# Patient Record
Sex: Female | Born: 1985 | Race: Black or African American | Hispanic: No | Marital: Single | Smoking: Current every day smoker
Health system: Southern US, Community
[De-identification: ages and names within clinical notes are randomized; demographics above are authoritative.]

## PROBLEM LIST (undated history)

## (undated) DIAGNOSIS — G4733 Obstructive sleep apnea (adult) (pediatric): Secondary | ICD-10-CM

## (undated) DIAGNOSIS — I10 Essential (primary) hypertension: Secondary | ICD-10-CM

## (undated) DIAGNOSIS — G2581 Restless legs syndrome: Secondary | ICD-10-CM

## (undated) HISTORY — DX: Restless legs syndrome: G25.81

## (undated) HISTORY — DX: Obstructive sleep apnea (adult) (pediatric): G47.33

---
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
## 2004-08-27 ENCOUNTER — Ambulatory Visit: Payer: Self-pay | Admitting: Pulmonary Disease

## 2004-09-13 ENCOUNTER — Other Ambulatory Visit: Admission: RE | Admit: 2004-09-13 | Discharge: 2004-09-13 | Payer: Self-pay | Admitting: Family Medicine

## 2004-12-21 ENCOUNTER — Emergency Department (HOSPITAL_COMMUNITY): Admission: EM | Admit: 2004-12-21 | Discharge: 2004-12-21 | Payer: Self-pay | Admitting: Emergency Medicine

## 2005-08-04 ENCOUNTER — Emergency Department (HOSPITAL_COMMUNITY): Admission: EM | Admit: 2005-08-04 | Discharge: 2005-08-04 | Payer: Self-pay | Admitting: Family Medicine

## 2005-09-25 ENCOUNTER — Other Ambulatory Visit: Admission: RE | Admit: 2005-09-25 | Discharge: 2005-09-25 | Payer: Self-pay | Admitting: Family Medicine

## 2005-10-10 ENCOUNTER — Emergency Department (HOSPITAL_COMMUNITY): Admission: EM | Admit: 2005-10-10 | Discharge: 2005-10-10 | Payer: Self-pay | Admitting: Emergency Medicine

## 2005-12-29 ENCOUNTER — Ambulatory Visit: Payer: Self-pay | Admitting: Pulmonary Disease

## 2006-01-28 ENCOUNTER — Encounter: Payer: Self-pay | Admitting: Pulmonary Disease

## 2006-01-28 ENCOUNTER — Ambulatory Visit (HOSPITAL_BASED_OUTPATIENT_CLINIC_OR_DEPARTMENT_OTHER): Admission: RE | Admit: 2006-01-28 | Discharge: 2006-01-28 | Payer: Self-pay | Admitting: Pulmonary Disease

## 2006-02-04 ENCOUNTER — Ambulatory Visit: Payer: Self-pay | Admitting: Pulmonary Disease

## 2006-02-19 ENCOUNTER — Ambulatory Visit: Payer: Self-pay | Admitting: Pulmonary Disease

## 2006-03-17 ENCOUNTER — Emergency Department (HOSPITAL_COMMUNITY): Admission: EM | Admit: 2006-03-17 | Discharge: 2006-03-17 | Payer: Self-pay | Admitting: Emergency Medicine

## 2006-03-27 ENCOUNTER — Ambulatory Visit: Payer: Self-pay | Admitting: Pulmonary Disease

## 2006-08-25 ENCOUNTER — Ambulatory Visit: Payer: Self-pay | Admitting: Pulmonary Disease

## 2007-01-20 ENCOUNTER — Emergency Department (HOSPITAL_COMMUNITY): Admission: EM | Admit: 2007-01-20 | Discharge: 2007-01-20 | Payer: Self-pay | Admitting: Emergency Medicine

## 2007-01-21 ENCOUNTER — Emergency Department (HOSPITAL_COMMUNITY): Admission: EM | Admit: 2007-01-21 | Discharge: 2007-01-21 | Payer: Self-pay | Admitting: Emergency Medicine

## 2007-05-22 ENCOUNTER — Encounter: Payer: Self-pay | Admitting: Pulmonary Disease

## 2007-06-22 ENCOUNTER — Emergency Department (HOSPITAL_COMMUNITY): Admission: EM | Admit: 2007-06-22 | Discharge: 2007-06-23 | Payer: Self-pay | Admitting: Emergency Medicine

## 2008-03-28 ENCOUNTER — Telehealth (INDEPENDENT_AMBULATORY_CARE_PROVIDER_SITE_OTHER): Payer: Self-pay | Admitting: *Deleted

## 2008-03-29 ENCOUNTER — Ambulatory Visit: Payer: Self-pay | Admitting: Pulmonary Disease

## 2008-03-29 DIAGNOSIS — G4733 Obstructive sleep apnea (adult) (pediatric): Secondary | ICD-10-CM

## 2008-03-29 DIAGNOSIS — G2581 Restless legs syndrome: Secondary | ICD-10-CM

## 2008-03-29 DIAGNOSIS — G47419 Narcolepsy without cataplexy: Secondary | ICD-10-CM

## 2008-03-31 ENCOUNTER — Telehealth (INDEPENDENT_AMBULATORY_CARE_PROVIDER_SITE_OTHER): Payer: Self-pay | Admitting: *Deleted

## 2008-06-23 ENCOUNTER — Encounter: Payer: Self-pay | Admitting: Pulmonary Disease

## 2008-06-26 ENCOUNTER — Encounter (INDEPENDENT_AMBULATORY_CARE_PROVIDER_SITE_OTHER): Payer: Self-pay | Admitting: *Deleted

## 2008-07-04 ENCOUNTER — Telehealth (INDEPENDENT_AMBULATORY_CARE_PROVIDER_SITE_OTHER): Payer: Self-pay | Admitting: *Deleted

## 2008-08-02 IMAGING — CR DG CHEST 2V
2 series · 2 of 2 positions shown · non-contrast
Comparison: None.

CLINICAL DATA: 21-year-old female, vomiting, shortness of breath.  
 CHEST ? 2 VIEW:

[w chest pa]
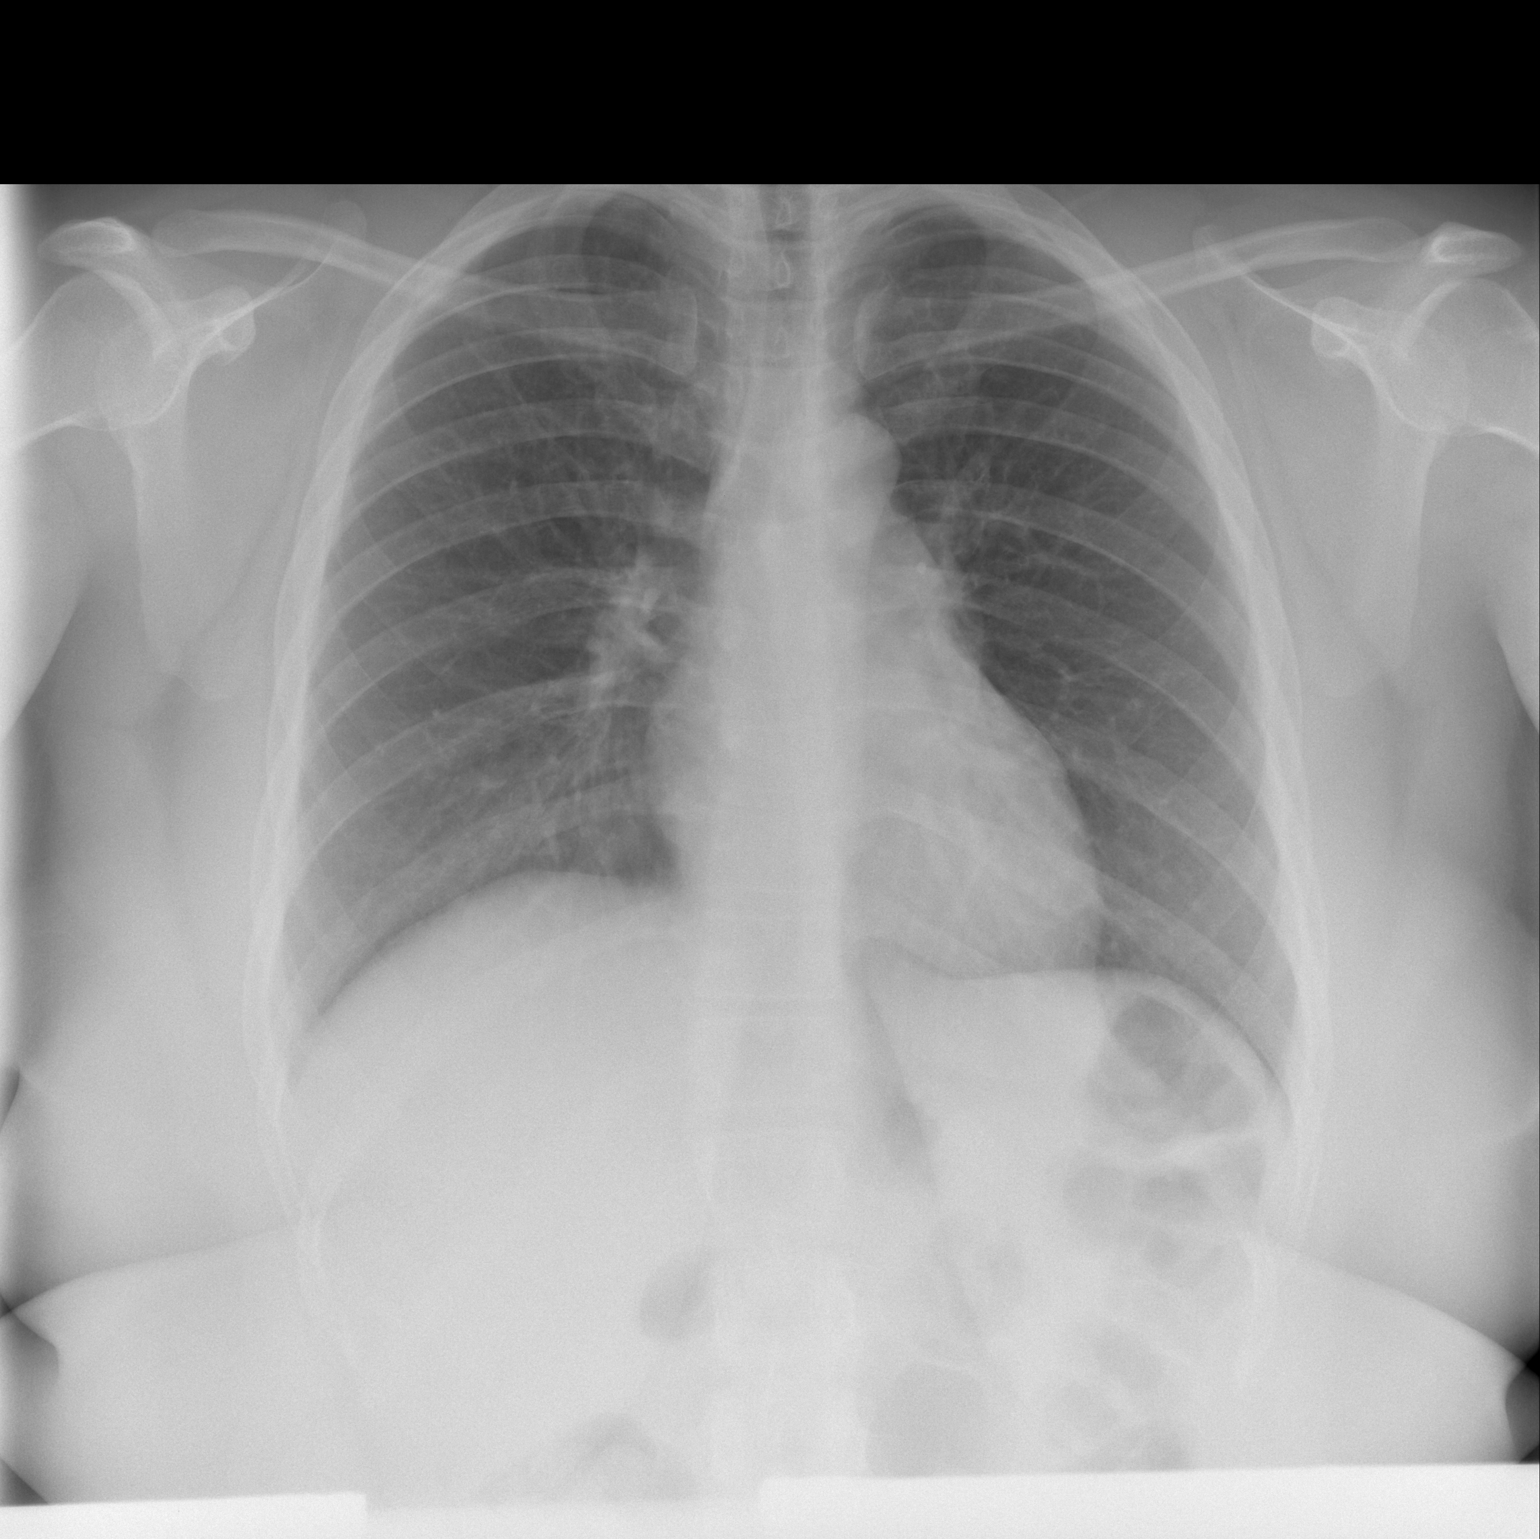

[w chest lat]
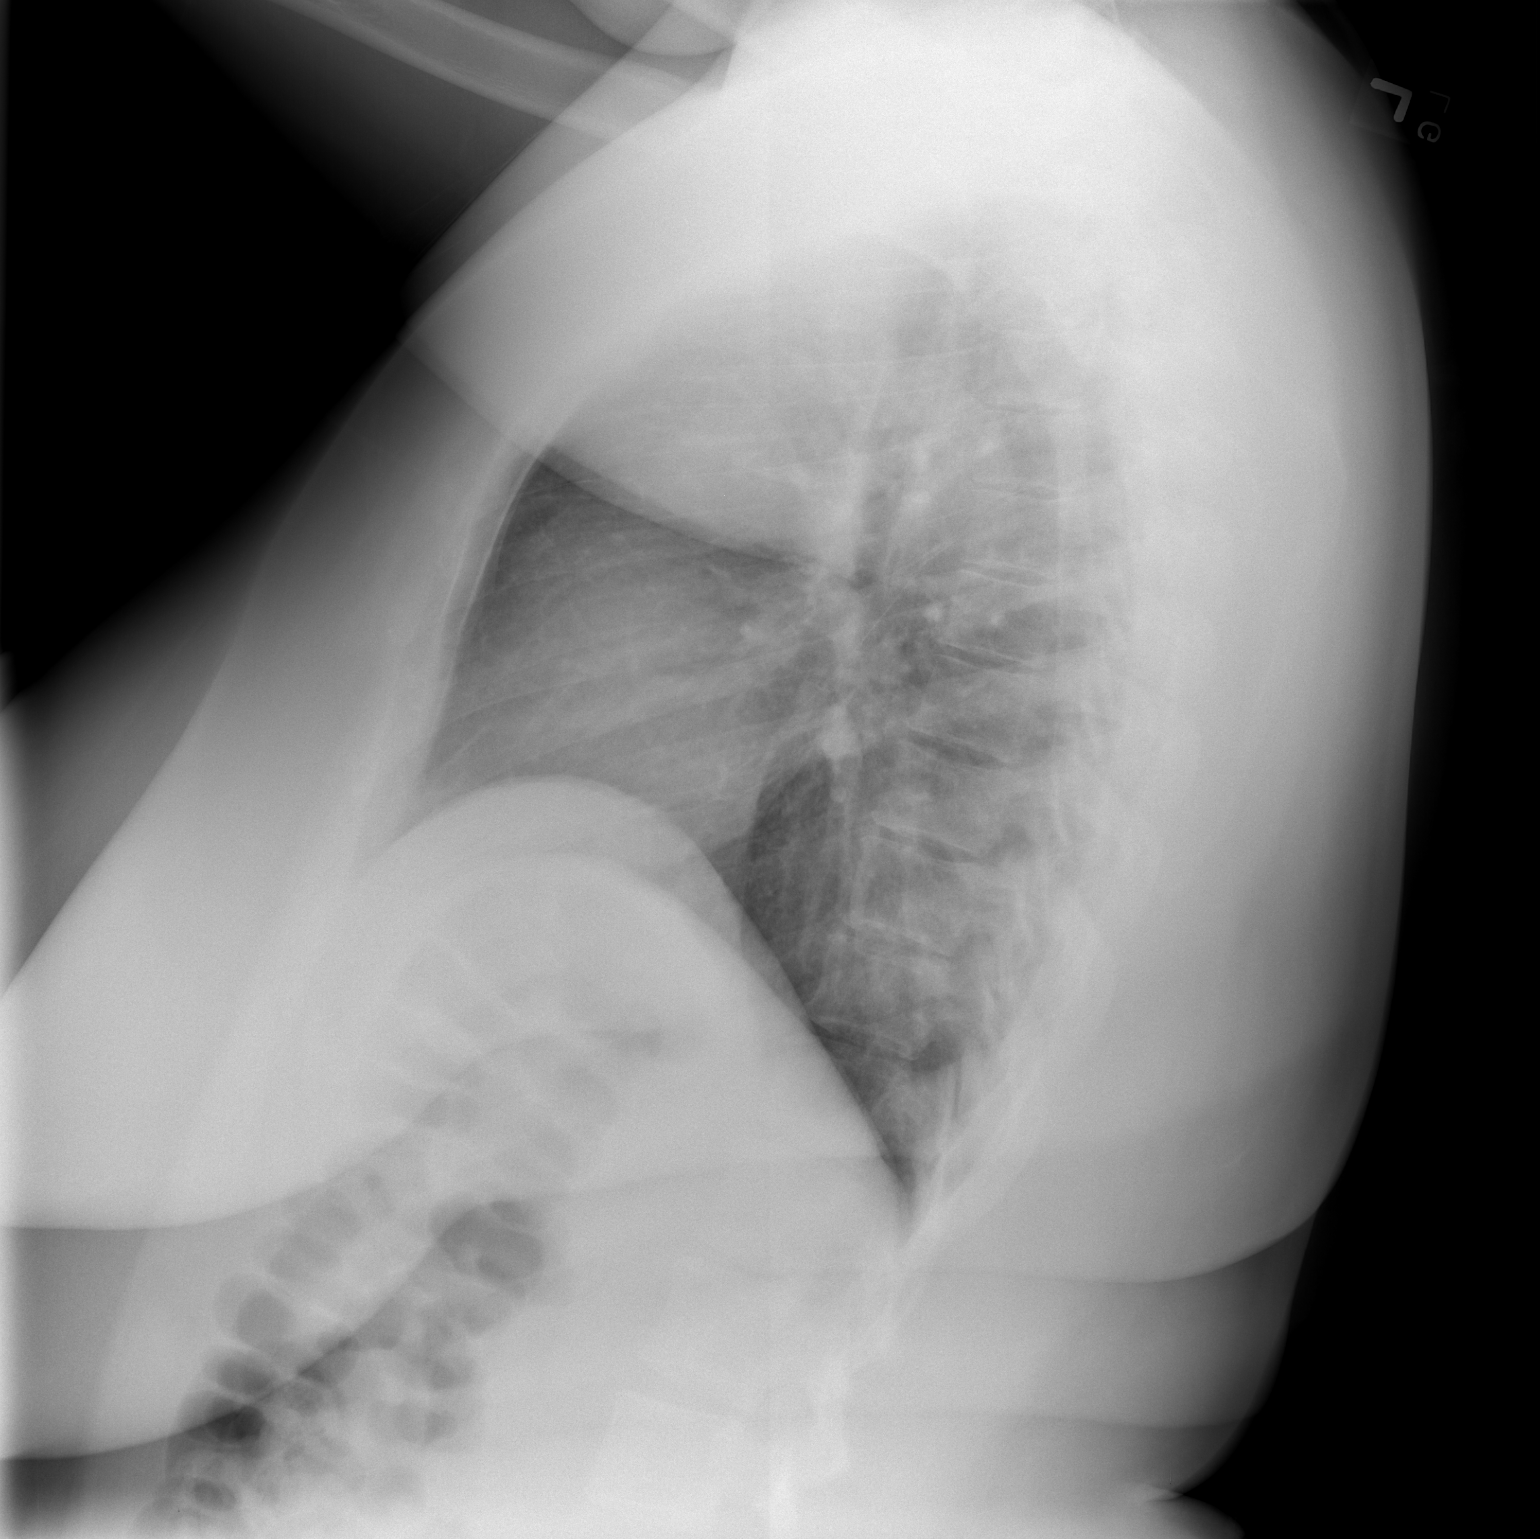

[2 of 2 positions shown; findings below may reference images not displayed]

FINDINGS: The heart size and mediastinal contours are within normal limits.  Both lungs are clear.  The visualized skeletal structures are unremarkable.
IMPRESSION: No active cardiopulmonary disease.

## 2008-08-06 ENCOUNTER — Emergency Department (HOSPITAL_COMMUNITY): Admission: EM | Admit: 2008-08-06 | Discharge: 2008-08-06 | Payer: Self-pay | Admitting: Emergency Medicine

## 2008-08-28 ENCOUNTER — Encounter: Payer: Self-pay | Admitting: Pulmonary Disease

## 2008-10-23 ENCOUNTER — Ambulatory Visit: Payer: Self-pay | Admitting: Pulmonary Disease

## 2009-02-08 ENCOUNTER — Encounter: Payer: Self-pay | Admitting: Pulmonary Disease

## 2009-02-13 ENCOUNTER — Emergency Department (HOSPITAL_COMMUNITY): Admission: EM | Admit: 2009-02-13 | Discharge: 2009-02-13 | Payer: Self-pay | Admitting: Emergency Medicine

## 2009-04-23 ENCOUNTER — Ambulatory Visit: Payer: Self-pay | Admitting: Pulmonary Disease

## 2009-06-08 ENCOUNTER — Emergency Department (HOSPITAL_COMMUNITY): Admission: EM | Admit: 2009-06-08 | Discharge: 2009-06-08 | Payer: Self-pay | Admitting: Emergency Medicine

## 2009-07-01 ENCOUNTER — Emergency Department (HOSPITAL_COMMUNITY): Admission: EM | Admit: 2009-07-01 | Discharge: 2009-07-01 | Payer: Self-pay | Admitting: Emergency Medicine

## 2009-07-12 ENCOUNTER — Encounter: Payer: Self-pay | Admitting: Pulmonary Disease

## 2009-07-18 ENCOUNTER — Emergency Department (HOSPITAL_COMMUNITY): Admission: EM | Admit: 2009-07-18 | Discharge: 2009-07-18 | Payer: Self-pay | Admitting: Emergency Medicine

## 2009-07-26 ENCOUNTER — Ambulatory Visit: Payer: Self-pay | Admitting: Obstetrics and Gynecology

## 2009-08-02 ENCOUNTER — Ambulatory Visit (HOSPITAL_COMMUNITY): Admission: RE | Admit: 2009-08-02 | Discharge: 2009-08-02 | Payer: Self-pay | Admitting: Obstetrics and Gynecology

## 2009-09-24 ENCOUNTER — Encounter: Payer: Self-pay | Admitting: Pulmonary Disease

## 2009-12-03 ENCOUNTER — Encounter: Payer: Self-pay | Admitting: Pulmonary Disease

## 2009-12-12 ENCOUNTER — Ambulatory Visit: Payer: Self-pay | Admitting: Pulmonary Disease

## 2010-04-08 ENCOUNTER — Encounter: Payer: Self-pay | Admitting: Obstetrics & Gynecology

## 2010-04-08 LAB — CONVERTED CEMR LAB: GC Probe Amp, Genital: NEGATIVE

## 2010-04-09 ENCOUNTER — Encounter: Payer: Self-pay | Admitting: Obstetrics & Gynecology

## 2010-06-12 ENCOUNTER — Ambulatory Visit: Payer: Self-pay | Admitting: Pulmonary Disease

## 2010-07-03 ENCOUNTER — Encounter: Payer: Self-pay | Admitting: Pulmonary Disease

## 2010-07-16 NOTE — Medication Information (Signed)
Summary: Nuvigil[facility]  [employer]   Imported By: Lester Skidmore 07/18/2009 11:06:30  _____________________________________________________________________  External Attachment:    Type:   Image     Comment:   External Document

## 2010-07-16 NOTE — Medication Information (Signed)
Summary: Nuvigil[facility]  [employer]   Imported By: Sherian Rein 12/13/2009 14:44:34  _____________________________________________________________________  External Attachment:    Type:   Image     Comment:   External Document

## 2010-07-16 NOTE — Medication Information (Signed)
Summary: Refill for Nuvigil[facility]  Refill for [employer]   Imported By: Sherian Rein 12/12/2009 08:42:33  _____________________________________________________________________  External Attachment:    Type:   Image     Comment:   External Document

## 2010-07-16 NOTE — Assessment & Plan Note (Signed)
Summary: rov for osa, narcolepsy   CC:  Pt is here for a 6 month f/u appt.  Pt states she is able to stay awake and concentrate throughout the day on Nuvigil.  Pt does c/o dry mouth and wonders if this is d/t the Nuvigil.   Pt states she is wearing her cpap machine every night.   Pt states she only wears her machine in 2 hour increments at a time because her "nose gets sore and bleeds."  .  History of Present Illness: the pt comes in today for f/u of her known osa and narcolepsy.  She is on cpap, but is having issues with nasal dryness and crusting, as well as dry mouth.  She is not adjusting the heater on her humidifier, and and admits to mouth opening.  She is not wearing a chin strap because it is "uncomfortable", and is not interested in wearing a full face mask because it is "uncomfortable".  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  Current Medications (verified): 1)  Nuvigil 150 Mg Tabs (Armodafinil) .... One Each Am  Allergies (verified): 1)  ! Benadryl  Past History:  Past Medical History: OSA narcolepsy PMH-FH-SH reviewed-no changes except otherwise noted  Review of Systems  The patient denies shortness of breath with activity, shortness of breath at rest, productive cough, non-productive cough, coughing up blood, chest pain, irregular heartbeats, acid heartburn, indigestion, loss of appetite, weight change, abdominal pain, difficulty swallowing, sore throat, tooth/dental problems, headaches, nasal congestion/difficulty breathing through nose, sneezing, itching, ear ache, anxiety, depression, hand/feet swelling, joint stiffness or pain, rash, change in color of mucus, and fever.    Vital Signs:  Patient profile:   25 year old female Height:      62 inches Weight:      280 pounds O2 Sat:      99 % on Room air Temp:     98.4 degrees F oral Pulse rate:   84 / minute BP sitting:   110 / 70  (left arm) Cuff size:   large  Vitals  Entered By: Arman Filter LPN (December 12, 2009 1:47 PM)  O2 Flow:  Room air  CC: Pt is here for a 6 month f/u appt.  Pt states she is able to stay awake and concentrate throughout the day on Nuvigil.  Pt does c/o dry mouth and wonders if this is d/t the Nuvigil.   Pt states she is wearing her cpap machine every night.   Pt states she only wears her machine in 2 hour increments at a time because her "nose gets sore and bleeds."   Comments Medications reviewed with patient Arman Filter LPN  December 12, 2009 1:47 PM    Physical Exam  General:  obese female in nad Nose:  no skin breakdown or pressure necrosis from cpap mask Extremities:  no edema or cyanosis Neurologic:  alert and oriented, moves all 4.   Impression & Recommendations:  Problem # 1:  OBSTRUCTIVE SLEEP APNEA (ICD-327.23)  the pt is having a lot of mouth opening by her history while wearing nasal pillows.  I have asked her to try a full face mask again, and reminded her there are a lot of new masks which may be more comfortable than what she has tried in the past.  She is not willing to do this, nor is she willing to try a chin strap again.  I have asked her to  try turning up the heat on her humidifier to help with nasal dryness.  I have also encouraged her to work aggressively on weight loss.  The pt also has a history suggestive of RLS, but is not willing to take a dopamine agonist.  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  She is able to remain functional as long as she stays on nuvigil.  I have reminded her again of the importance of sleep hygiene, catnaps when she is able.  Other Orders: Est. Patient Level IV (47829)  Patient Instructions: 1)  consider trying a full face mask again or chin strap 2)  work on weight loss 3)  continue with nuvigil to help with daytime alertness. 4)  followup with me in 6mos.

## 2010-07-16 NOTE — Medication Information (Signed)
Summary: Approval for Patient Assistance Program/[facility]  Approval for Patient Assistance Program/[facility]   Imported By: Lanelle Bal 09/27/2009 10:22:16  _____________________________________________________________________  External Attachment:    Type:   Image     Comment:   External Document

## 2010-07-18 NOTE — Assessment & Plan Note (Signed)
Summary: rov for osa/narcolepsy   CC:  6 month follow up. pt states she uses her cpap everynight x 4-5 hrs a night. Pt states she needs a new mask. Marland Kitchen  History of Present Illness: the pt comes in today for f/u of her multiple sleep issues.  She has known osa and narcolepsy, and is being maintained on cpap and nuvigil to help with her functional status during waking hours.  Unfortunately, she has not kept to any type of sleep schedule, and now is sleeping during the day and staying awake at night?  She states this "is a better schedule for her".  She states she is wearing cpap during the day while sleeping, and takes nuvigil to stay awake as needed.  Unfortunately, she has continued to gain weight (up 7 pounds from last visit).    Current Medications (verified): 1)  Nuvigil 150 Mg Tabs (Armodafinil) .... One Each Am  Allergies (verified): 1)  ! Benadryl  Past History:  Past Medical History: OSA--AHI 6/hr 2003.  mask issues, but unwilling to try full face or chin strap narcolepsy--+MSLT 2004 with latency 2.21min and 5 sorems. RLS--not willing to stay on dopamine agonist  Review of Systems       The patient complains of weight change.  The patient denies shortness of breath with activity, shortness of breath at rest, productive cough, non-productive cough, coughing up blood, chest pain, irregular heartbeats, acid heartburn, indigestion, loss of appetite, abdominal pain, difficulty swallowing, sore throat, tooth/dental problems, headaches, nasal congestion/difficulty breathing through nose, sneezing, itching, ear ache, anxiety, depression, hand/feet swelling, joint stiffness or pain, rash, change in color of mucus, and fever.    Vital Signs:  Patient profile:   25 year old female Height:      62 inches Weight:      286.50 pounds BMI:     52.59 O2 Sat:      98 % on Room air Temp:     98.1 degrees F oral Pulse rate:   94 / minute BP sitting:   124 / 88  (left arm) Cuff size:    large  Vitals Entered By: Carver Fila (June 12, 2010 1:29 PM)  O2 Flow:  Room air CC: 6 month follow up. pt states she uses her cpap everynight x 4-5 hrs a night. Pt states she needs a new mask.  Comments meds and allergies updated Phone number updated Carver Fila  June 12, 2010 1:29 PM    Physical Exam  General:  obese female in nad Extremities:  no edema or cyanosis  Neurologic:  alert and oriented, moves all 4. does not appear overly sleepy.   Impression & Recommendations:  Problem # 1:  OBSTRUCTIVE SLEEP APNEA (ICD-327.23) the pt is sleeping during the day and staying up at night by personal choice.  She is wearing cpap during the day when she sleeps, and feels this does help.  She is in need of a new mask, and I have also stressed to her the need to work on weight loss.    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  I have stressed to her again the importance of a consistent sleep schedule and good sleep hygiene.    Other Orders: Est. Patient Level III (14782) DME Referral (DME)  Patient Instructions: 1)  continue with cpap during your sleeping hours 2)  will get a new mask for you thru dme.  3)  work on weight loss 4)  continue with nuvigil as needed during your waking hours to remain functional 5)  followup with me in 6mos.

## 2010-07-18 NOTE — Medication Information (Signed)
Summary: Refill form for Nuvigil/[facility]   Refill form for Nuvigil/[facility]   Imported By: Sherian Rein 07/12/2010 11:42:58  _____________________________________________________________________  External Attachment:    Type:   Image     Comment:   External Document

## 2010-09-04 LAB — URINALYSIS, ROUTINE W REFLEX MICROSCOPIC
Bilirubin Urine: NEGATIVE
Glucose, UA: NEGATIVE mg/dL
Ketones, ur: NEGATIVE mg/dL
Protein, ur: NEGATIVE mg/dL
Specific Gravity, Urine: 1.021 (ref 1.005–1.030)
pH: 6.5 (ref 5.0–8.0)

## 2010-09-04 LAB — POCT PREGNANCY, URINE: Preg Test, Ur: NEGATIVE

## 2010-09-07 ENCOUNTER — Emergency Department (HOSPITAL_COMMUNITY)
Admission: EM | Admit: 2010-09-07 | Discharge: 2010-09-08 | Disposition: A | Discharge status: Home / Self Care | Payer: Self-pay | Attending: Emergency Medicine | Admitting: Emergency Medicine

## 2010-09-07 DIAGNOSIS — Z8614 Personal history of Methicillin resistant Staphylococcus aureus infection: Secondary | ICD-10-CM | POA: Insufficient documentation

## 2010-09-07 DIAGNOSIS — R059 Cough, unspecified: Secondary | ICD-10-CM | POA: Insufficient documentation

## 2010-09-07 DIAGNOSIS — R05 Cough: Secondary | ICD-10-CM | POA: Insufficient documentation

## 2010-09-07 DIAGNOSIS — R509 Fever, unspecified: Secondary | ICD-10-CM | POA: Insufficient documentation

## 2010-09-07 DIAGNOSIS — J40 Bronchitis, not specified as acute or chronic: Secondary | ICD-10-CM | POA: Insufficient documentation

## 2010-09-07 DIAGNOSIS — R350 Frequency of micturition: Secondary | ICD-10-CM | POA: Insufficient documentation

## 2010-09-08 LAB — URINALYSIS, ROUTINE W REFLEX MICROSCOPIC
Bilirubin Urine: NEGATIVE
Glucose, UA: NEGATIVE mg/dL
Hgb urine dipstick: NEGATIVE
Ketones, ur: NEGATIVE mg/dL
Nitrite: NEGATIVE
Protein, ur: NEGATIVE mg/dL
Specific Gravity, Urine: 1.023 (ref 1.005–1.030)

## 2010-09-13 IMAGING — US US TRANSVAGINAL NON-OB
1 series · 13 of 25 positions shown · non-contrast
Comparison: None.

CLINICAL DATA: Amenorrhea for 3 years.

TRANSABDOMINAL AND TRANSVAGINAL ULTRASOUND OF PELVIS
TECHNIQUE: Both transabdominal and transvaginal ultrasound
examinations of the pelvis were performed including evaluation of
the uterus, ovaries, adnexal regions, and pelvic cul-de-sac.

[Series 1: us transvaginal non-ob · 0.30mm/px · 13 of 45 slices shown]
[im 1/45]
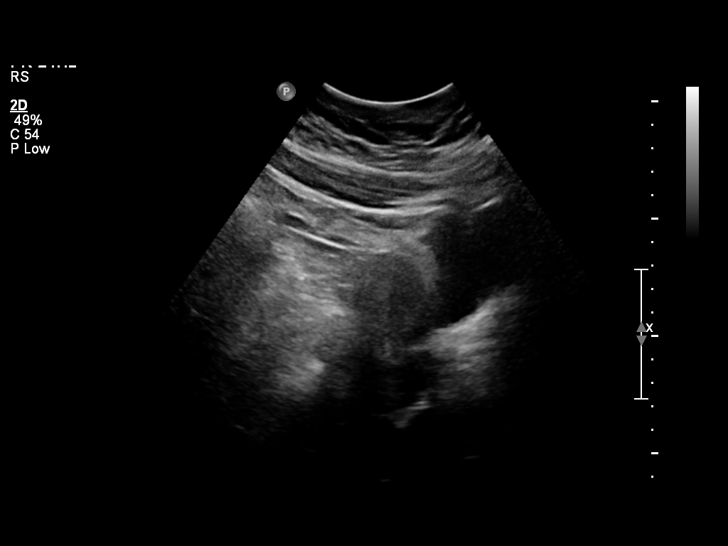
[im 4/45]
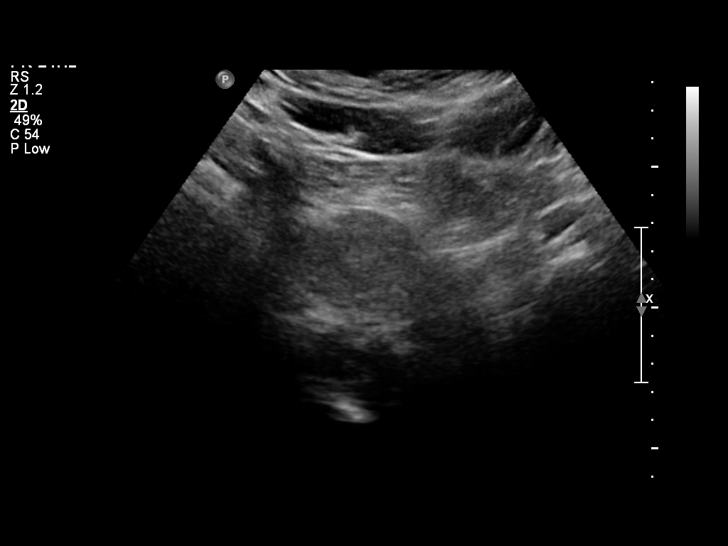
[im 8/45]
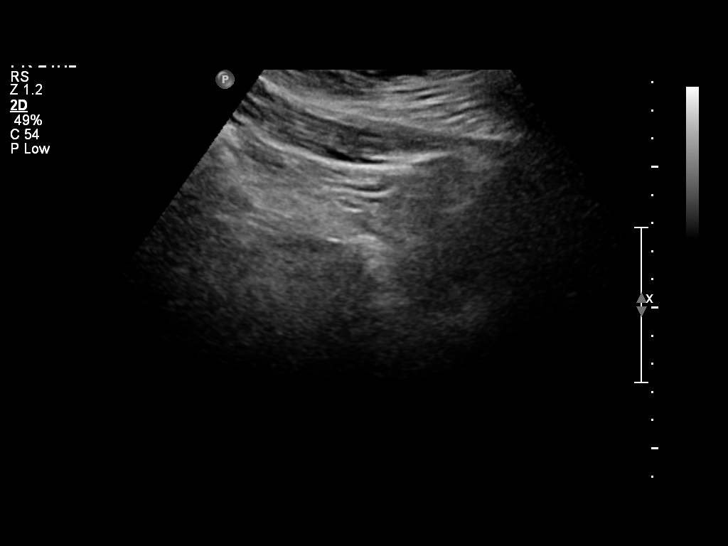
[im 12/45]
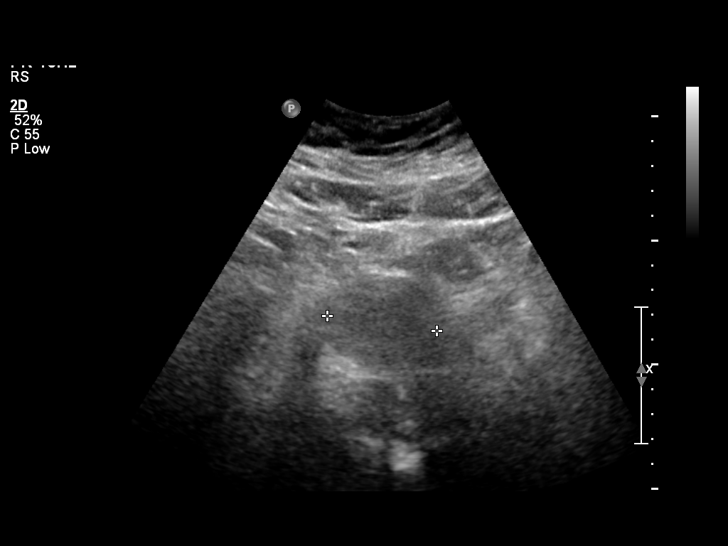
[im 15/45]
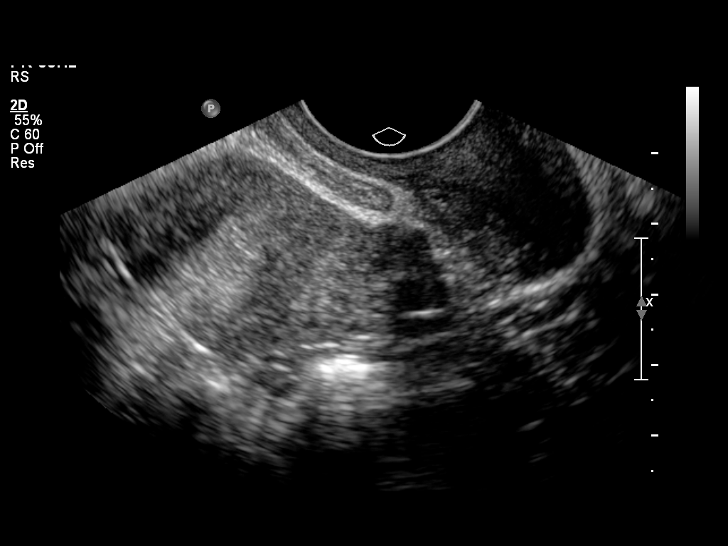
[im 19/45]
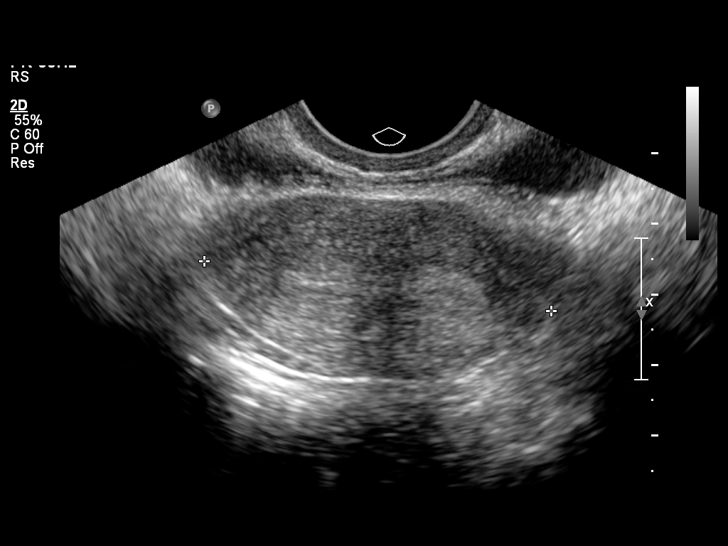
[im 23/45]
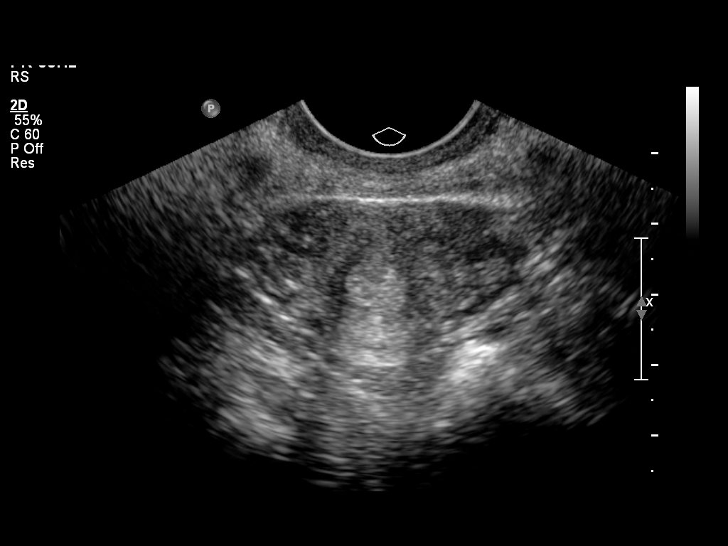
[im 26/45]
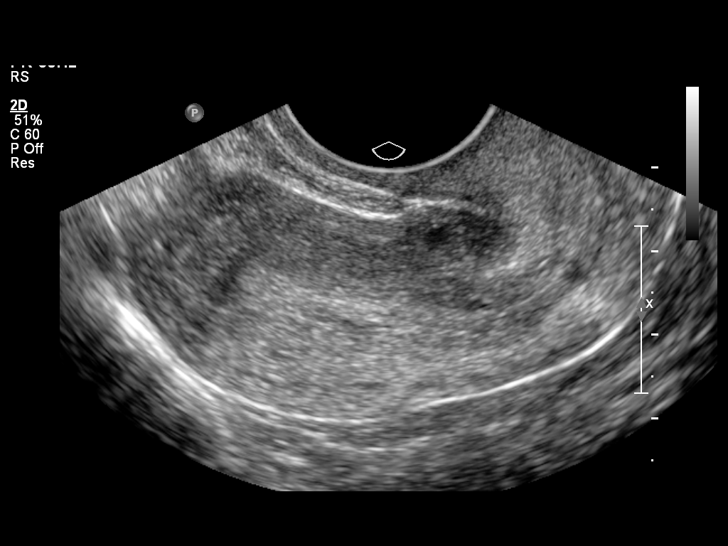
[im 30/45]
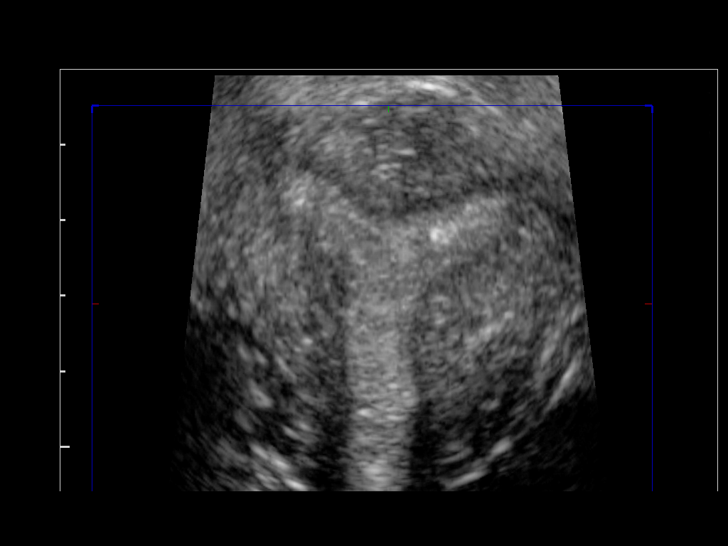
[im 34/45]
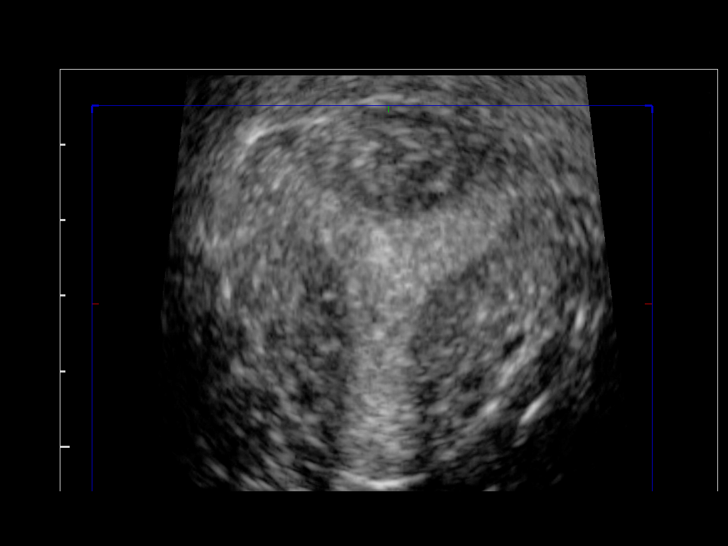
[im 37/45]
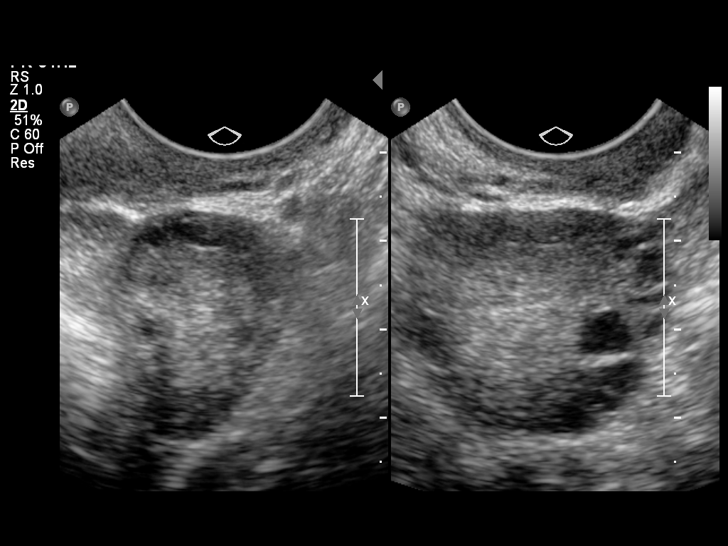
[im 41/45]
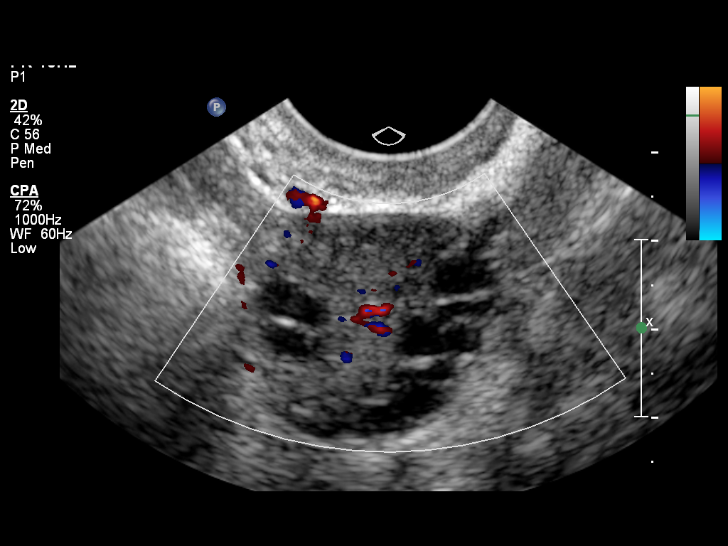
[im 45/45]
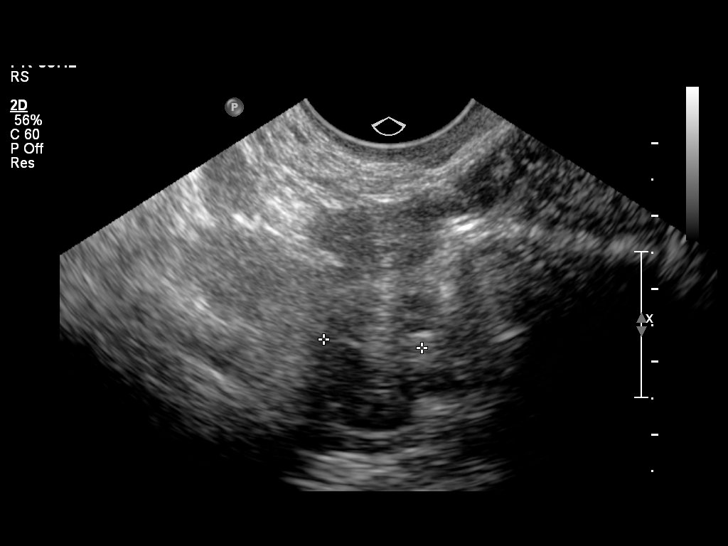

[13 of 25 positions shown; findings below may reference images not displayed]

FINDINGS: Uterus demonstrates a sagittal length of 7.1 cm, an AP width of
cm and a transverse width of 5.0 cm.  A homogeneous uterine
myometrium is seen.

Endometrium is thin and echogenic with an AP width of 4.0 mm.  No
areas of focal thickening or inhomogeneity noted.  On coronal
reconstructed images, incidental note is made of a arcuate
morphology to the endometrial canal.

Right Ovary the right ovary is enlarged measuring 4.1 x 3.4 by
cm.  This ovary was difficult to visualize both endovaginally and
transabdominally and the internal architecture of the ovary is
difficult to assess.

Left Ovary measures 3.1 x 2.7 x 2.0 cm and contains greater than 10
sub centimeter follicles oriented peripherally around the ovarian
stroma.  The central stroma appears slightly echogenic.  Central
vascularity is unremarkable.

Other Findings:  No pelvic fluid or separate adnexal masses are
seen.
IMPRESSION: Normal uterine myometrium and endometrium.  Poorly assessed right
ovary due to poor scanning parameters.

Normal left ovarian size is noted but the orientation of multiple
sub centimeter follicles and slight increase in central
echogenicity raises the possibility of underlying polycystic
ovarian syndrome.  Clinical correlation is recommended.

## 2010-09-16 LAB — URINALYSIS, ROUTINE W REFLEX MICROSCOPIC
Bilirubin Urine: NEGATIVE
Ketones, ur: NEGATIVE mg/dL
Protein, ur: NEGATIVE mg/dL
Urobilinogen, UA: 0.2 mg/dL (ref 0.0–1.0)
pH: 5.5 (ref 5.0–8.0)

## 2010-09-16 LAB — POCT PREGNANCY, URINE: Preg Test, Ur: NEGATIVE

## 2010-09-21 LAB — RAPID STREP SCREEN (MED CTR MEBANE ONLY): Streptococcus, Group A Screen (Direct): NEGATIVE

## 2010-10-18 ENCOUNTER — Telehealth: Payer: Self-pay | Admitting: Pulmonary Disease

## 2010-10-21 NOTE — Telephone Encounter (Signed)
Spoke to pt she is aware we received forms

## 2010-11-01 NOTE — Procedures (Signed)
NAME:  Tiffany Allen, Tiffany Allen NO.:  [id number]   MEDICAL RECORD NO.:  [id number]          PATIENT TYPE:  OUT   LOCATION:  SLEEP CENTER                 FACILITY:  [facility]   PHYSICIAN:  Barbaraann Share, MD,FCCPDATE OF BIRTH:  03-31-1985   DATE OF STUDY:  01/28/2006                              NOCTURNAL POLYSOMNOGRAM    .  REFERRING PHYSICIAN:  Dr. Marcelyn Bruins.   INDICATIONS FOR STUDY:  Persistent disorder of initiating and maintaining  wakefulness.   EPWORTH SCORE:  20.   SLEEP ARCHITECTURE:  The patient had total sleep time of 355 minutes with  adequate slow wave sleep but decreased REM.  Sleep onset latency was fairly  rapid at 5 minutes, and REM onset was normal at 107 minutes. Sleep  efficiency was decreased at 71%.   RESPIRATORY DATA:  The patient was found to have 36 hypopneas and 16 apneas  for respiratory disturbance index of 9 events per hour. Events were not  positional, but there was light to moderate snoring noted throughout in an  intermittent fashion.   OXYGEN DATA:  There was O2 desaturation as low as 81% with the patient's  obstructive events.   CARDIAC DATA:  No clinically significant cardiac arrhythmias.   MOVEMENT/PARASOMNIA:  The patient was found to have 125 leg jerks with 2 per  hour resulting in arousal or awakening.   IMPRESSION/RECOMMENDATIONS:  1. Mild obstructive sleep apnea/hypopnea syndrome with a respiratory      disturbance index of 9 events per hour and O2 desaturation as low as      81%. Treatment for this degree of sleep apnea should focus on weight      loss alone if applicable, upper airway surgery, oral appliance, and      also CPAP.  2. It should be noted the whole study was done with the TV turned on in      the patient's room.  3. Very large numbers of leg jerks with what appears to be significant      sleep disruption. Clinical correlation is suggested.          ______________________________  Barbaraann Share,  MD,FCCP  Diplomate, American Board of Sleep  Medicine   [facility]  D:  02/03/2006 15:43:17  T:  02/04/2006 09:25:11  Job:  [id number]

## 2010-11-01 NOTE — Assessment & Plan Note (Signed)
[facility]                               PULMONARY OFFICE NOTE   Tiffany Allen, Tiffany Allen                     MRN:          [id number]  DATE:02/19/2006                            DOB:          1985/06/21    Tiffany Allen comes in today for follow-up of her continued inappropriate  daytime sleepiness.  She had seen a significant worsening that was leading  to difficulties in school and also her general functional status during the  day.  The patient had been doing fairly well on Provigil and was having to  take it only on an intermittent basis because of financial issues.  We have  gotten her hooked up with the patient assistance programs for Ce[employer]on, and  I have also asked her to look into applying for disability.  Because of the  patient's worsening snoring and daytime symptoms she underwent follow-up  nocturnal polysomnography, which showed a respiratory disturbance index of 9  events per hour and O2 saturation as low as 81%.  She also had light to  moderate snoring noted throughout.  This was an increase in her respiratory  disturbance index from the last study.  The patient also was noted to have  125 leg jerks, 2 per hour, resulting in arousal or awakening.  I have had  some concerns about the possibility of restless leg/periodic leg movements  in the past and have considered a treatment trial with Requip.   PHYSICAL EXAM:  VITAL SIGNS:  BP 116/80, pulse 83, temperature 97.7, weight  is 260 pounds, O2 saturation on room air is 99%.  GENERAL:  She is an obese black female in no acute distress.   IMPRESSION:  1. Narcolepsy without cataplexy.  This may be the sole reason for her      sleepiness.  However, she has had a significant increase in her      symptoms, but has also been only taking Provigil intermittently.  At      this point in time, we need to get her back on this on a regular basis.      She is applying for financial assistance for  Cephalon and also applying      for disability.  2. Questionable superimposed sleep apnea and restless leg syndrome, which      may be contributing to her daytime symptoms.  At this point in time,      the patient is not keen on CPAP, but is willing to give Requip a try.      If she does not see improvement, I really think she needs to give CPAP      a try as well.  She obviously needs to work aggressively on weight      loss.   PLAN:  1. Back on Provigil, 200 mg q. day/b.i.d.  2. Requip 1 mg nightly in an escalating fashion.  Samples have been given      for the patient.  3. The patient is to follow-up in 4 weeks if symptoms or problems.  If she      is continuing  to have severe daytime symptomatology, I would recommend      that she have a trial of CPAP.                                    Barbaraann Share, MD,FCCP   [facility]  DD:  02/27/2006  DT:  03/01/2006  Job #:  [id number]   cc:   Stacie Acres. Cliffton Asters, M.D.

## 2010-12-31 ENCOUNTER — Encounter: Payer: Self-pay | Admitting: Pulmonary Disease

## 2011-01-01 ENCOUNTER — Ambulatory Visit (INDEPENDENT_AMBULATORY_CARE_PROVIDER_SITE_OTHER): Payer: Self-pay | Admitting: Pulmonary Disease

## 2011-01-01 ENCOUNTER — Encounter: Payer: Self-pay | Admitting: Pulmonary Disease

## 2011-01-01 DIAGNOSIS — G47419 Narcolepsy without cataplexy: Secondary | ICD-10-CM

## 2011-01-01 DIAGNOSIS — G4733 Obstructive sleep apnea (adult) (pediatric): Secondary | ICD-10-CM

## 2011-01-01 MED ORDER — MODAFINIL 200 MG PO TABS: 200.0000 mg | ORAL_TABLET | ORAL | Status: DC

## 2011-01-01 NOTE — Progress Notes (Signed)
  Subjective:    Patient ID: Tiffany Allen, female    DOB: 18-Feb-1986, 25 y.o.   MRN: [id number]  HPI The pt comes in today for f/u of her known osa and narcolepsy.  She is wearing cpap compliantly, and denies any issues with mask fit or pressure.  She feels she sleeps well with the device.  She is taking nuvigil for her narcolepsy, but thinks it is causing her diarrhea.  This has never been worked up in the past.  Unfortunately, she is not losing weight.     Review of Systems  Constitutional: Negative for fever and unexpected weight change.  HENT: Negative for ear pain, nosebleeds, congestion, sore throat, rhinorrhea, sneezing, trouble swallowing, dental problem, postnasal drip and sinus pressure.   Eyes: Negative for redness and itching.  Respiratory: Negative for cough, chest tightness, shortness of breath and wheezing.   Cardiovascular: Negative for palpitations and leg swelling.  Gastrointestinal: Negative for nausea and vomiting.  Genitourinary: Negative for dysuria.  Musculoskeletal: Negative for joint swelling.  Skin: Negative for rash.  Neurological: Negative for headaches.  Hematological: Does not bruise/bleed easily.  Psychiatric/Behavioral: Negative for dysphoric mood. The patient is not nervous/anxious.        Objective:   Physical Exam Morbidly obese female in nad Nares without purulence or discharge, no skin breakdown or pressure necrosis from cpap mask. LE without edema, no cyanosis noted. Awake, but appears sleepy, moves all 4        Assessment & Plan:

## 2011-01-01 NOTE — Patient Instructions (Signed)
Continue with cpap, and work on weight loss Continue to take naps when able during day Will change you nuvigil to provigil 200mg  each am followup with me in 6mos.

## 2011-01-04 ENCOUNTER — Encounter: Payer: Self-pay | Admitting: Pulmonary Disease

## 2011-01-04 NOTE — Assessment & Plan Note (Signed)
The pt has known osa for which she is on cpap.  She denies any issues with mask fit or pressure, and feels that she sleeps well with the device.  I have asked her to keep up with mask changes and supplies, and counseled her that she must work on weight loss.

## 2011-01-04 NOTE — Assessment & Plan Note (Signed)
The pt believes her nuvigil is causing diarrhea, but I suspect this is not the case.  Will change her over to provigil, but if the issue continues, would suggest GI evaluation.  I do not want to start her on meds such as ritalin given her young age, and risk of CV complications over a long period of time.

## 2011-01-14 ENCOUNTER — Telehealth: Payer: Self-pay | Admitting: *Deleted

## 2011-01-14 NOTE — Telephone Encounter (Signed)
When pt was seen by [facility] on 7/18, he changed her from Nuvigil to Provigil.  Pt currently gets her Nuvigil free through the [employer].  Pt had requested the same for her Provigil.  Spoke with Almyra Free on this and Almyra Free informed me that because Provigil has a generic, the [employer] doesn't carry this med for patients.  Therefore pt will need to go back to the Nuvigil in order to get this med for free or pay for the Provigil.  Kc, please advise what you prefer to do.  Thanks.

## 2011-01-14 NOTE — Telephone Encounter (Signed)
She told me she was having diarrhea from the nuvigil, hence I changed her to provigil.  Is there some type of assistance program for provigil (I am not sure who is making the generic)?  If not, she will have to decide if the nuvigil really is causing her diarrhea?? (I suspect it is not).  I would like to avoid the other meds that have a lot of cardiovascular side effects long term.

## 2011-01-15 NOTE — Telephone Encounter (Signed)
Will forward to [facility] for recs on this, pls advise thanks!

## 2011-01-16 NOTE — Telephone Encounter (Signed)
PT HAS DECIDED TO STAY ON THE NUVIGIL BECAUSE THERE IS NO PROGRAM FOR PROVIGIL AND SHE HAS NO INCOME COMING IN

## 2011-03-05 LAB — CBC
Hemoglobin: 14.4
MCHC: 33.9
MCV: 82.3
Platelets: 294
RBC: 5.18 — ABNORMAL HIGH
RDW: 13.7
WBC: 14.1 — ABNORMAL HIGH

## 2011-03-05 LAB — BASIC METABOLIC PANEL
Chloride: 101
Creatinine, Ser: 0.88
GFR calc Af Amer: >60
GFR calc non Af Amer: >60

## 2011-03-05 LAB — DIFFERENTIAL
Eosinophils Absolute: 0.4
Eosinophils Relative: 3
Lymphocytes Relative: 39
Neutro Abs: 7.2
Neutrophils Relative %: 51

## 2011-03-05 LAB — URINALYSIS, ROUTINE W REFLEX MICROSCOPIC
Glucose, UA: NEGATIVE
Hgb urine dipstick: NEGATIVE
Nitrite: NEGATIVE
Specific Gravity, Urine: 1.026

## 2011-03-31 LAB — URINALYSIS, ROUTINE W REFLEX MICROSCOPIC
Glucose, UA: NEGATIVE
Leukocytes, UA: NEGATIVE
Nitrite: NEGATIVE
Specific Gravity, Urine: 1.033 — ABNORMAL HIGH
pH: 6

## 2011-03-31 LAB — URINE MICROSCOPIC-ADD ON

## 2011-03-31 LAB — RAPID STREP SCREEN (MED CTR MEBANE ONLY): Streptococcus, Group A Screen (Direct): POSITIVE — AB

## 2011-04-30 ENCOUNTER — Telehealth: Payer: Self-pay | Admitting: Pulmonary Disease

## 2011-04-30 NOTE — Telephone Encounter (Signed)
Pt was calling to find out if her medication list or history shows her ever taking dopamine agonist-Pt had copy of 06-12-10 OV notes-after researching into this I explained to the patient that her medication list doesn't reflect any use of it.

## 2011-06-18 ENCOUNTER — Ambulatory Visit (INDEPENDENT_AMBULATORY_CARE_PROVIDER_SITE_OTHER): Payer: Self-pay | Admitting: Pulmonary Disease

## 2011-06-18 ENCOUNTER — Encounter: Payer: Self-pay | Admitting: Pulmonary Disease

## 2011-06-18 DIAGNOSIS — G47419 Narcolepsy without cataplexy: Secondary | ICD-10-CM

## 2011-06-18 DIAGNOSIS — G2581 Restless legs syndrome: Secondary | ICD-10-CM

## 2011-06-18 DIAGNOSIS — G4733 Obstructive sleep apnea (adult) (pediatric): Secondary | ICD-10-CM

## 2011-06-18 MED ORDER — ROPINIROLE HCL 0.5 MG PO TABS: 0.5000 mg | ORAL_TABLET | ORAL | Status: DC

## 2011-06-18 NOTE — Assessment & Plan Note (Signed)
The patient is continuing to have significant leg jerks during the night, and symptoms of restless leg syndrome even when she is awake.  I would like to try her again on a dopamine agonist to see if they will improve.

## 2011-06-18 NOTE — Assessment & Plan Note (Signed)
The patient is trying to stick with good sleep hygiene, and is taking individual during the day to help with alertness.  She is having GI issues with this, and we'll therefore try and get her on Provigil.  I would like to try to avoid other stimulant medications that have cardiovascular side effects.

## 2011-06-18 NOTE — Assessment & Plan Note (Signed)
Patient states that she is wearing CPAP compliantly, and is having no issues with her mask fit or pressure.  I have asked her to work aggressively on weight loss, and believe this is the key for a lot of her issues.

## 2011-06-18 NOTE — Patient Instructions (Signed)
Will try and do a preauthorization to get provigil. Stay on cpap, work on weight loss Will start back on requip 0.5mg  after dinner to see if helps your leg jerks. followup with me in 6mos.

## 2011-06-18 NOTE — Progress Notes (Signed)
  Subjective:    Patient ID: Tiffany Allen, female    DOB: May 09, 1986, 26 y.o.   MRN: [id number]  HPI The patient comes in today for follow up of her multiple sleep issues.  She has known objective sleep apnea for which she is wearing CPAP.  She denies any issues with the mask fit or pressure, and feels that she sleeps well with the device.  She also has narcolepsy for which she is taking individual during the day, but continues to have diarrhea with this.  We had tried to switch her over to Provigil, but apparently this is not covered on her insurance plan.  We'll try to get this preauthorized.  She is also still having issues with leg symptoms consistent with restless leg syndrome.   Review of Systems  Constitutional: Negative for fever and unexpected weight change.  HENT: Negative for ear pain, nosebleeds, congestion, sore throat, rhinorrhea, sneezing, trouble swallowing, dental problem, postnasal drip and sinus pressure.   Eyes: Negative for redness and itching.  Respiratory: Negative for cough, chest tightness, shortness of breath and wheezing.   Cardiovascular: Positive for leg swelling. Negative for palpitations.  Gastrointestinal: Negative for nausea and vomiting.  Genitourinary: Negative for dysuria.  Musculoskeletal: Negative for joint swelling.  Skin: Negative for rash.  Neurological: Positive for headaches.  Hematological: Does not bruise/bleed easily.  Psychiatric/Behavioral: Negative for dysphoric mood. The patient is not nervous/anxious.        Objective:   Physical Exam Obese female in no acute distress  No skin breakdown or pressure necrosis from the CPAP mask Lower extremities with mild edema, no cyanosis Alert and oriented, moves all 4 extremities.        Assessment & Plan:

## 2011-12-12 ENCOUNTER — Telehealth: Payer: Self-pay | Admitting: Pulmonary Disease

## 2011-12-12 NOTE — Telephone Encounter (Signed)
ATC the number given and it, as well as the cell number have been d/c'ed. Called the work number listed and NA, no option to leave a msg, [employer]

## 2011-12-15 NOTE — Telephone Encounter (Signed)
Called # listed in chart and states it has been d/c or no longer in service. Called work [employer] and [employer]

## 2011-12-16 NOTE — Telephone Encounter (Signed)
Home number and cell number ar disconnected, called work number NA, no voicemail. Will sign off on message and await a call back. Please get correct contact info for the pt. Carron Curie, CMA

## 2011-12-29 ENCOUNTER — Ambulatory Visit: Payer: Self-pay | Admitting: Pulmonary Disease

## 2012-01-28 ENCOUNTER — Encounter: Payer: Self-pay | Admitting: Pulmonary Disease

## 2012-01-28 ENCOUNTER — Ambulatory Visit (INDEPENDENT_AMBULATORY_CARE_PROVIDER_SITE_OTHER): Payer: Self-pay | Admitting: Pulmonary Disease

## 2012-01-28 VITALS — BP 116/80 | HR 111 | Temp 98.1°F | Ht 62.0 in | Wt 280.6 lb

## 2012-01-28 DIAGNOSIS — G47419 Narcolepsy without cataplexy: Secondary | ICD-10-CM

## 2012-01-28 DIAGNOSIS — G2581 Restless legs syndrome: Secondary | ICD-10-CM

## 2012-01-28 DIAGNOSIS — G4733 Obstructive sleep apnea (adult) (pediatric): Secondary | ICD-10-CM

## 2012-01-28 MED ORDER — METHYLPHENIDATE HCL 10 MG PO TABS: 10.0000 mg | ORAL_TABLET | Freq: Two times a day (BID) | ORAL | Status: DC

## 2012-01-28 NOTE — Progress Notes (Signed)
  Subjective:    Patient ID: Tiffany Allen, female    DOB: 05/15/1986, 26 y.o.   MRN: [id number]  HPI The patient comes in today for followup of her known obstructive sleep apnea, narcolepsy, and restless leg syndrome.  She has been wearing CPAP compliantly by her history, and reports no issues with her mask or pressure.  She unfortunately was unable to afford Requip, he didn't the generic version, and therefore has left her limb movements and treated.  She also continues to have loose stools after taking Nuvigil, and it is unclear if she is even absorbing the medication.  We had talked about trying Ritalin since it was more affordable, but she had concerns about cardiovascular impacts.   Review of Systems  Constitutional: Negative for fever and unexpected weight change.  HENT: Negative for ear pain, nosebleeds, congestion, sore throat, rhinorrhea, sneezing, trouble swallowing, dental problem, postnasal drip and sinus pressure.   Eyes: Negative for redness and itching.  Respiratory: Negative for cough, chest tightness, shortness of breath and wheezing.   Cardiovascular: Negative for palpitations and leg swelling.  Gastrointestinal: Positive for diarrhea. Negative for nausea and vomiting.  Genitourinary: Negative for dysuria.  Musculoskeletal: Negative for joint swelling.  Skin: Negative for rash.  Neurological: Negative for headaches.  Hematological: Does not bruise/bleed easily.  Psychiatric/Behavioral: Negative for dysphoric mood. The patient is not nervous/anxious.        Objective:   Physical Exam Morbidly obese female in no acute distress No skin breakdown or pressure necrosis from the CPAP mask Lower extremities without edema, no cyanosis Awake, but does appear mildly sleepy, moves all 4 extremities.       Assessment & Plan:

## 2012-01-28 NOTE — Addendum Note (Signed)
Addended by: Nita Sells on: 01/28/2012 04:14 PM   Modules accepted: Orders

## 2012-01-28 NOTE — Assessment & Plan Note (Signed)
She is wearing CPAP compliance but by her history, and reports no issues with her mask or pressure.  I have stressed the importance of aggressive weight loss.

## 2012-01-28 NOTE — Assessment & Plan Note (Signed)
The patient did not start on Requip because of her inability to afford the medication.  There is really no other medication that would be cheaper since it is already generic.

## 2012-01-28 NOTE — Assessment & Plan Note (Addendum)
The patient continues to have issues with diarrhea after taking nuvigil, and it is unclear if Tiffany Allen is even absorbing the medication.  Tiffany Allen was unable to get provigil.  Will start Tiffany Allen on ritalin, but I have cautioned Tiffany Allen about possible cardiac effects.  Tiffany Allen will need to monitor Tiffany Allen blood pressure more closely.  Tiffany Allen understands there are no other affordable options that will not have some impact on Tiffany Allen cardiovascular status.

## 2012-01-28 NOTE — Patient Instructions (Addendum)
Will stop nuvigil, and start on ritalin 10mg  in am and afternoon.  Please monitor blood pressure for changes. Continue on cpap, and work on weight loss followup with me in 6mos.

## 2012-07-30 ENCOUNTER — Ambulatory Visit: Payer: Self-pay | Admitting: Pulmonary Disease

## 2012-11-25 ENCOUNTER — Telehealth: Payer: Self-pay | Admitting: *Deleted

## 2012-11-25 NOTE — Telephone Encounter (Signed)
Patient aware that renewal form has been received by [employer]. Pt to come pick up form and fill out her portion. Patient aware to bring this paperwork back fairly quick so that Dr Shelle Iron may sign and we can get this sent back to company so that she does not loose her eligibility.

## 2013-07-01 ENCOUNTER — Emergency Department (HOSPITAL_COMMUNITY)
Admission: EM | Admit: 2013-07-01 | Discharge: 2013-07-01 | Disposition: A | Discharge status: Home / Self Care | Payer: Self-pay | Attending: Emergency Medicine | Admitting: Emergency Medicine

## 2013-07-01 ENCOUNTER — Emergency Department (HOSPITAL_COMMUNITY): Payer: Self-pay

## 2013-07-01 ENCOUNTER — Encounter (HOSPITAL_COMMUNITY): Payer: Self-pay | Admitting: Emergency Medicine

## 2013-07-01 DIAGNOSIS — Z3202 Encounter for pregnancy test, result negative: Secondary | ICD-10-CM | POA: Insufficient documentation

## 2013-07-01 DIAGNOSIS — F17200 Nicotine dependence, unspecified, uncomplicated: Secondary | ICD-10-CM | POA: Insufficient documentation

## 2013-07-01 DIAGNOSIS — R05 Cough: Secondary | ICD-10-CM | POA: Insufficient documentation

## 2013-07-01 DIAGNOSIS — R000 Tachycardia, unspecified: Secondary | ICD-10-CM | POA: Insufficient documentation

## 2013-07-01 DIAGNOSIS — G47419 Narcolepsy without cataplexy: Secondary | ICD-10-CM | POA: Insufficient documentation

## 2013-07-01 DIAGNOSIS — R059 Cough, unspecified: Secondary | ICD-10-CM | POA: Insufficient documentation

## 2013-07-01 DIAGNOSIS — J111 Influenza due to unidentified influenza virus with other respiratory manifestations: Secondary | ICD-10-CM

## 2013-07-01 DIAGNOSIS — G2581 Restless legs syndrome: Secondary | ICD-10-CM | POA: Insufficient documentation

## 2013-07-01 DIAGNOSIS — G4733 Obstructive sleep apnea (adult) (pediatric): Secondary | ICD-10-CM | POA: Insufficient documentation

## 2013-07-01 DIAGNOSIS — J3489 Other specified disorders of nose and nasal sinuses: Secondary | ICD-10-CM | POA: Insufficient documentation

## 2013-07-01 DIAGNOSIS — R5383 Other fatigue: Secondary | ICD-10-CM

## 2013-07-01 DIAGNOSIS — R0789 Other chest pain: Secondary | ICD-10-CM | POA: Insufficient documentation

## 2013-07-01 DIAGNOSIS — R69 Illness, unspecified: Secondary | ICD-10-CM

## 2013-07-01 DIAGNOSIS — IMO0001 Reserved for inherently not codable concepts without codable children: Secondary | ICD-10-CM | POA: Insufficient documentation

## 2013-07-01 DIAGNOSIS — R110 Nausea: Secondary | ICD-10-CM | POA: Insufficient documentation

## 2013-07-01 DIAGNOSIS — B9789 Other viral agents as the cause of diseases classified elsewhere: Secondary | ICD-10-CM | POA: Insufficient documentation

## 2013-07-01 DIAGNOSIS — M549 Dorsalgia, unspecified: Secondary | ICD-10-CM | POA: Insufficient documentation

## 2013-07-01 DIAGNOSIS — R5381 Other malaise: Secondary | ICD-10-CM | POA: Insufficient documentation

## 2013-07-01 LAB — URINALYSIS, ROUTINE W REFLEX MICROSCOPIC
BILIRUBIN URINE: NEGATIVE
Glucose, UA: NEGATIVE mg/dL
KETONES UR: NEGATIVE mg/dL
LEUKOCYTES UA: NEGATIVE
NITRITE: NEGATIVE
PH: 6 (ref 5.0–8.0)
PROTEIN: NEGATIVE mg/dL
Specific Gravity, Urine: 1.027 (ref 1.005–1.030)
Urobilinogen, UA: 0.2 mg/dL (ref 0.0–1.0)

## 2013-07-01 LAB — COMPREHENSIVE METABOLIC PANEL
ALK PHOS: 92 U/L (ref 39–117)
ALT: 13 U/L (ref 0–35)
AST: 13 U/L (ref 0–37)
Albumin: 3.5 g/dL (ref 3.5–5.2)
BILIRUBIN TOTAL: 0.2 mg/dL — AB (ref 0.3–1.2)
BUN: 10 mg/dL (ref 6–23)
CHLORIDE: 100 meq/L (ref 96–112)
CO2: 22 meq/L (ref 19–32)
Calcium: 8.8 mg/dL (ref 8.4–10.5)
Creatinine, Ser: 1.17 mg/dL — ABNORMAL HIGH (ref 0.50–1.10)
GFR calc non Af Amer: 63 mL/min — ABNORMAL LOW (ref >90)
GFR, EST AFRICAN AMERICAN: 73 mL/min — AB (ref >90)
GLUCOSE: 104 mg/dL — AB (ref 70–99)
POTASSIUM: 4.2 meq/L (ref 3.7–5.3)
SODIUM: 134 meq/L — AB (ref 137–147)
Total Protein: 7.9 g/dL (ref 6.0–8.3)

## 2013-07-01 LAB — CBC
HEMATOCRIT: 41.6 % (ref 36.0–46.0)
HEMOGLOBIN: 14 g/dL (ref 12.0–15.0)
MCH: 28.2 pg (ref 26.0–34.0)
MCHC: 33.7 g/dL (ref 30.0–36.0)
MCV: 83.7 fL (ref 78.0–100.0)
Platelets: 242 10*3/uL (ref 150–400)
RBC: 4.97 MIL/uL (ref 3.87–5.11)
RDW: 14 % (ref 11.5–15.5)
WBC: 8.5 10*3/uL (ref 4.0–10.5)

## 2013-07-01 LAB — URINE MICROSCOPIC-ADD ON

## 2013-07-01 LAB — PREGNANCY, URINE: Preg Test, Ur: NEGATIVE

## 2013-07-01 MED ORDER — OSELTAMIVIR PHOSPHATE 75 MG PO CAPS: 75.0000 mg | ORAL_CAPSULE | Freq: Two times a day (BID) | ORAL | Status: DC

## 2013-07-01 MED ORDER — HYDROCODONE-HOMATROPINE 5-1.5 MG/5ML PO SYRP
5.0000 mL | ORAL_SOLUTION | ORAL | Status: DC | PRN
  Administered 2013-07-01: 5 mL via ORAL
  Filled 2013-07-01: qty 5

## 2013-07-01 MED ORDER — OSELTAMIVIR PHOSPHATE 75 MG PO CAPS
75.0000 mg | ORAL_CAPSULE | Freq: Once | ORAL | Status: AC
  Administered 2013-07-01: 75 mg via ORAL
  Filled 2013-07-01: qty 1

## 2013-07-01 MED ORDER — SODIUM CHLORIDE 0.9 % IV BOLUS (SEPSIS)
1000.0000 mL | Freq: Once | INTRAVENOUS | Status: AC
  Administered 2013-07-01: 1000 mL via INTRAVENOUS

## 2013-07-01 MED ORDER — IBUPROFEN 800 MG PO TABS
800.0000 mg | ORAL_TABLET | Freq: Once | ORAL | Status: AC
  Administered 2013-07-01: 800 mg via ORAL
  Filled 2013-07-01: qty 1

## 2013-07-01 MED ORDER — HYDROCODONE-HOMATROPINE 5-1.5 MG/5ML PO SYRP: 5.0000 mL | ORAL_SOLUTION | Freq: Four times a day (QID) | ORAL | Status: DC | PRN

## 2013-07-01 MED ORDER — IBUPROFEN 600 MG PO TABS: 600.0000 mg | ORAL_TABLET | Freq: Four times a day (QID) | ORAL | Status: DC | PRN

## 2013-07-01 NOTE — ED Notes (Signed)
Pt states she took tylenol 2 hours ago

## 2013-07-01 NOTE — ED Provider Notes (Signed)
CSN: [id number]     Arrival date & time 07/01/13  0424 History   First MD Initiated Contact with Patient 07/01/13 0515     Chief Complaint  Patient presents with  . Emesis  . Fever  . Cough   (Consider location/radiation/quality/duration/timing/severity/associated sxs/prior Treatment) HPI Patient presents with 2 days of fever, chills, diffuse myalgias, productive cough, posttussive emesis and chest tightness. She has no known sick contacts. She has no lower extremity swelling or pain. She has no diarrhea or abdominal pain. She does complain of right-sided thoracic back pain. It is worse with movement. She denies any urinary symptoms including dysuria, frequency, urgency or hematuria. She has no vaginal bleeding or discharge. Past Medical History  Diagnosis Date  . OSA (obstructive sleep apnea)   . Narcolepsy   . RLS (restless legs syndrome)    History reviewed. No pertinent past surgical history. History reviewed. No pertinent family history. History  Substance Use Topics  . Smoking status: Current Every Day Smoker -- 0.50 packs/day for 8 years    Types: Cigarettes  . Smokeless tobacco: Never Used  . Alcohol Use: Not on file   OB History   Grav Para Term Preterm Abortions TAB SAB Ect Mult Living                 Review of Systems  Constitutional: Positive for fever, chills and fatigue.  HENT: Positive for congestion. Negative for sore throat.   Respiratory: Positive for cough and chest tightness. Negative for shortness of breath and wheezing.   Cardiovascular: Negative for chest pain, palpitations and leg swelling.  Gastrointestinal: Positive for vomiting. Negative for nausea, abdominal pain, diarrhea and constipation.  Genitourinary: Negative for dysuria, frequency, hematuria and flank pain.  Musculoskeletal: Positive for back pain and myalgias. Negative for neck pain and neck stiffness.  Skin: Negative for rash and wound.  Neurological: Negative for dizziness, weakness,  light-headedness, numbness and headaches.  All other systems reviewed and are negative.    Allergies  Diphenhydramine hcl  Home Medications   Current Outpatient Rx  Name  Route  Sig  Dispense  Refill  . acetaminophen (TYLENOL) 500 MG tablet   Oral   Take 500 mg by mouth every 6 (six) hours as needed.           BP 119/69  Pulse 100  Temp(Src) 99.5 F (37.5 C) (Oral)  Resp 18  SpO2 97% Physical Exam  Nursing note and vitals reviewed. Constitutional: She is oriented to person, place, and time. She appears well-developed and well-nourished. No distress.  HENT:  Head: Normocephalic and atraumatic.  Mouth/Throat: Oropharynx is clear and moist. No oropharyngeal exudate.  No sinus tenderness to percussion. Bilateral nasal mucosal edema.  Eyes: EOM are normal. Pupils are equal, round, and reactive to light.  Neck: Normal range of motion. Neck supple.  No neck stiffness or other evidence of meningismus  Cardiovascular: Regular rhythm.  Exam reveals no gallop and no friction rub.   No murmur heard. Tachycardia  Pulmonary/Chest: Effort normal and breath sounds normal. No respiratory distress. She has no wheezes. She has no rales. She exhibits no tenderness.  Abdominal: Soft. Bowel sounds are normal. She exhibits no distension and no mass. There is no tenderness. There is no rebound and no guarding.  Musculoskeletal: Normal range of motion. She exhibits tenderness (right-sided thoracic rhomboid and subscapular tenderness to palpation. No calf tenderness or swelling.). She exhibits no edema.  Neurological: She is alert and oriented to person, place, and time.  Patient is alert and oriented x3 with clear, goal oriented speech. Patient has 5/5 motor in all extremities. Sensation is intact to light touch.  Skin: Skin is warm and dry. No rash noted. No erythema.  Psychiatric: She has a normal mood and affect. Her behavior is normal.    ED Course  Procedures (including critical care  time) Labs Review Labs Reviewed  COMPREHENSIVE METABOLIC PANEL - Abnormal; Notable for the following:    Sodium 134 (*)    Glucose, Bld 104 (*)    Creatinine, Ser 1.17 (*)    Total Bilirubin 0.2 (*)    GFR calc non Af Amer 63 (*)    GFR calc Af Amer 73 (*)    All other components within normal limits  URINALYSIS, ROUTINE W REFLEX MICROSCOPIC - Abnormal; Notable for the following:    APPearance CLOUDY (*)    Hgb urine dipstick SMALL (*)    All other components within normal limits  URINE MICROSCOPIC-ADD ON - Abnormal; Notable for the following:    Squamous Epithelial / LPF FEW (*)    All other components within normal limits  CBC  PREGNANCY, URINE   Imaging Review No results found.  EKG Interpretation   None      Date: 07/01/2013  Rate: 100  Rhythm: sinus tachycardia  QRS Axis: normal  Intervals: normal  ST/T Wave abnormalities: normal  Conduction Disutrbances:none  Narrative Interpretation:   Old EKG Reviewed: none available    MDM  Suspect patient has influenza-like illness. We'll get a chest x-ray to rule out pneumonia.  Chest tightness likely related to viral illness. Very low suspicion for CAD. We'll screen with EKG.  Patient with improved symptoms. Vital signs normalized. Discharge home with symptomatic treatment. Return precautions have been given. Patient's voiced an understanding  Loren Racer, MD 07/07/13 [phone]

## 2013-07-01 NOTE — Discharge Instructions (Signed)

## 2013-07-01 NOTE — ED Notes (Signed)
Pt complains of a fever, cough and vomiting for two days

## 2014-07-19 ENCOUNTER — Emergency Department (HOSPITAL_COMMUNITY)
Admission: EM | Admit: 2014-07-19 | Discharge: 2014-07-19 | Disposition: A | Discharge status: Home / Self Care | Payer: Self-pay | Attending: Emergency Medicine | Admitting: Emergency Medicine

## 2014-07-19 ENCOUNTER — Encounter (HOSPITAL_COMMUNITY): Payer: Self-pay

## 2014-07-19 DIAGNOSIS — Z3202 Encounter for pregnancy test, result negative: Secondary | ICD-10-CM | POA: Insufficient documentation

## 2014-07-19 DIAGNOSIS — M549 Dorsalgia, unspecified: Secondary | ICD-10-CM | POA: Insufficient documentation

## 2014-07-19 DIAGNOSIS — Z720 Tobacco use: Secondary | ICD-10-CM | POA: Insufficient documentation

## 2014-07-19 DIAGNOSIS — Z8669 Personal history of other diseases of the nervous system and sense organs: Secondary | ICD-10-CM | POA: Insufficient documentation

## 2014-07-19 DIAGNOSIS — R300 Dysuria: Secondary | ICD-10-CM | POA: Insufficient documentation

## 2014-07-19 DIAGNOSIS — R197 Diarrhea, unspecified: Secondary | ICD-10-CM | POA: Insufficient documentation

## 2014-07-19 LAB — URINALYSIS, ROUTINE W REFLEX MICROSCOPIC
Bilirubin Urine: NEGATIVE
GLUCOSE, UA: NEGATIVE mg/dL
KETONES UR: NEGATIVE mg/dL
Nitrite: NEGATIVE
PROTEIN: NEGATIVE mg/dL
SPECIFIC GRAVITY, URINE: 1.025 (ref 1.005–1.030)
UROBILINOGEN UA: 1 mg/dL (ref 0.0–1.0)
pH: 6 (ref 5.0–8.0)

## 2014-07-19 LAB — URINE MICROSCOPIC-ADD ON

## 2014-07-19 LAB — PREGNANCY, URINE: PREG TEST UR: NEGATIVE

## 2014-07-19 MED ORDER — LOPERAMIDE HCL 2 MG PO CAPS: 2.0000 mg | ORAL_CAPSULE | Freq: Four times a day (QID) | ORAL | Status: DC | PRN

## 2014-07-19 MED ORDER — CYCLOBENZAPRINE HCL 10 MG PO TABS: 10.0000 mg | ORAL_TABLET | Freq: Two times a day (BID) | ORAL | Status: DC | PRN

## 2014-07-19 NOTE — ED Notes (Signed)
Pt states diarrhea, increased urination with burning, back pain radiating to bilateral sides.  Symptoms x 3-4 days.  No fever.  No nausea / vomiting.  Pt also states unable to control bowels with the loose stools

## 2014-07-19 NOTE — ED Provider Notes (Signed)
CSN: [id number]     Arrival date & time 07/19/14  1041 History   First MD Initiated Contact with Patient 07/19/14 1050     Chief Complaint  Patient presents with  . Dysuria  . Diarrhea     (Consider location/radiation/quality/duration/timing/severity/associated sxs/prior Treatment) Patient is a 29 y.o. female presenting with dysuria and diarrhea. The history is provided by the patient. No language interpreter was used.  Dysuria Pain quality:  Burning Pain severity:  Moderate Onset quality:  Gradual Duration:  4 days Timing:  Constant Progression:  Unchanged Chronicity:  New Recent urinary tract infections: no   Relieved by:  Nothing Worsened by:  Nothing tried Ineffective treatments:  None tried Urinary symptoms: no discolored urine, no foul-smelling urine, no frequent urination, no hesitancy and no bladder incontinence   Associated symptoms: no abdominal pain, no fever, no genital lesions, no nausea, no vaginal discharge and no vomiting   Risk factors: no hx of pyelonephritis, no hx of urolithiasis, no recurrent urinary tract infections, no renal cysts, no renal disease, not sexually active, not single kidney, no sexually transmitted infections and no urinary catheter   Diarrhea Quality:  Watery Severity:  Moderate Onset quality:  Gradual Number of episodes:  2 per day Duration:  4 days Timing:  Intermittent Progression:  Unchanged Relieved by:  Nothing Worsened by:  Nothing tried Ineffective treatments:  None tried Associated symptoms: no abdominal pain, no arthralgias, no chills, no fever and no vomiting   Risk factors: no recent antibiotic use, no sick contacts, no suspicious food intake and no travel to endemic areas     Past Medical History  Diagnosis Date  . OSA (obstructive sleep apnea)   . Narcolepsy   . RLS (restless legs syndrome)    History reviewed. No pertinent past surgical history. History reviewed. No pertinent family history. History  Substance Use  Topics  . Smoking status: Current Every Day Smoker -- 0.50 packs/day for 8 years    Types: Cigarettes  . Smokeless tobacco: Never Used  . Alcohol Use: No   OB History    No data available     Review of Systems  Constitutional: Negative for fever, chills and fatigue.  HENT: Negative for trouble swallowing.   Eyes: Negative for visual disturbance.  Respiratory: Negative for shortness of breath.   Cardiovascular: Negative for chest pain and palpitations.  Gastrointestinal: Positive for diarrhea. Negative for nausea, vomiting and abdominal pain.  Genitourinary: Positive for dysuria. Negative for vaginal discharge and difficulty urinating.  Musculoskeletal: Positive for back pain. Negative for arthralgias and neck pain.  Skin: Negative for color change.  Neurological: Negative for dizziness and weakness.  Psychiatric/Behavioral: Negative for dysphoric mood.      Allergies  Diphenhydramine hcl and Ibuprofen  Home Medications   Prior to Admission medications   Medication Sig Start Date End Date Taking? Authorizing Provider  acetaminophen (TYLENOL) 500 MG tablet Take 500 mg by mouth every 6 (six) hours as needed for moderate pain or headache.    Yes Historical Provider, MD   BP 137/93 mmHg  Pulse 96  Temp(Src) 98.7 F (37.1 C) (Oral)  Resp 20  SpO2 98% Physical Exam  Constitutional: She is oriented to person, place, and time. She appears well-developed and well-nourished. No distress.  HENT:  Head: Normocephalic and atraumatic.  Eyes: Conjunctivae and EOM are normal.  Neck: Normal range of motion.  Cardiovascular: Normal rate and regular rhythm.  Exam reveals no gallop and no friction rub.   No murmur  heard. Pulmonary/Chest: Effort normal and breath sounds normal. She has no wheezes. She has no rales. She exhibits no tenderness.  Abdominal: Soft. She exhibits no distension. There is no tenderness. There is no rebound and no guarding.  Musculoskeletal: Normal range of  motion.  No midline spine tenderness to palpation. Bilateral lower lumbar paraspinal tenderness to palpation. No CVA tenderness.   Neurological: She is alert and oriented to person, place, and time. Coordination normal.  Speech is goal-oriented. Moves limbs without ataxia.   Skin: Skin is warm and dry.  Psychiatric: She has a normal mood and affect. Her behavior is normal.  Nursing note and vitals reviewed.   ED Course  Procedures (including critical care time) Labs Review Labs Reviewed  URINALYSIS, ROUTINE W REFLEX MICROSCOPIC - Abnormal; Notable for the following:    APPearance CLOUDY (*)    Hgb urine dipstick TRACE (*)    Leukocytes, UA SMALL (*)    All other components within normal limits  URINE CULTURE  PREGNANCY, URINE  URINE MICROSCOPIC-ADD ON    Imaging Review No results found.   EKG Interpretation None      MDM   Final diagnoses:  Dysuria  Diarrhea    12:09 PM Patient's urinalysis unremarkable for acute changes. Patient's diarrhea appears to be related to viral illness. Vitals stable and patient afebrile. Patient's back pain likely muscular. Patient will have muscle relaxer and imodium for diarrhea.    Emilia Beck, PA-C 07/19/14 1215  Juliet Rude. Rubin Payor, MD 07/19/14 1655

## 2014-07-19 NOTE — ED Notes (Signed)
Pt attempting urine and stool sample at present time.

## 2014-07-19 NOTE — ED Notes (Signed)
Pt ONLY able to provide urine sample at present time.

## 2014-07-19 NOTE — Discharge Instructions (Signed)
Take imodium as needed for diarrhea. Take Flexeril as needed for muscle spasm. Refer to attached documents for more information.

## 2014-07-20 LAB — URINE CULTURE
Colony Count: NO GROWTH
Culture: NO GROWTH
Special Requests: NORMAL

## 2014-12-20 ENCOUNTER — Emergency Department (HOSPITAL_COMMUNITY)
Admission: EM | Admit: 2014-12-20 | Discharge: 2014-12-20 | Disposition: A | Discharge status: Home / Self Care | Payer: Self-pay | Attending: Emergency Medicine | Admitting: Emergency Medicine

## 2014-12-20 ENCOUNTER — Encounter (HOSPITAL_COMMUNITY): Payer: Self-pay | Admitting: *Deleted

## 2014-12-20 DIAGNOSIS — Z720 Tobacco use: Secondary | ICD-10-CM | POA: Insufficient documentation

## 2014-12-20 DIAGNOSIS — M545 Low back pain: Secondary | ICD-10-CM | POA: Insufficient documentation

## 2014-12-20 DIAGNOSIS — M5431 Sciatica, right side: Secondary | ICD-10-CM | POA: Insufficient documentation

## 2014-12-20 DIAGNOSIS — Z8669 Personal history of other diseases of the nervous system and sense organs: Secondary | ICD-10-CM | POA: Insufficient documentation

## 2014-12-20 MED ORDER — METHOCARBAMOL 500 MG PO TABS: 500.0000 mg | ORAL_TABLET | Freq: Two times a day (BID) | ORAL | Status: DC

## 2014-12-20 NOTE — ED Provider Notes (Signed)
CSN: [id number]     Arrival date & time 12/20/14  1847 History  This chart was scribed for non-physician practitioner, [facility] M. Damian Leavell, NP, working with Benjiman Core, MD, by Budd Palmer ED Scribe. This patient was seen in room TR02C/TR02C and the patient's care was started at 7:42 PM    Chief Complaint  Patient presents with  . Back Pain   Patient is a 29 y.o. female presenting with back pain. The history is provided by the patient. No language interpreter was used.  Back Pain Location:  Lumbar spine Quality:  Shooting Radiates to:  L knee and R knee Pain severity:  Moderate Pain is:  Unable to specify Duration:  3 days Timing:  Constant Progression:  Worsening Chronicity:  New Relieved by:  NSAIDs Associated symptoms: no abdominal pain, no dysuria and no fever    HPI Comments: Tiffany Allen is a 29 y.o. female smoker at 0.5 ppd who presents to the Emergency Department complaining of sharp, shooting lower back pain onset 3 days ago. She reports that the pain is shooting down her legs to her knees, which started feeling numb today. She reports taking 2 Bayer Aspirin before bed last night, which provided mild relief. Pt states that the pain was still there when she woke up this morning. She reports no prior history of similar back pain. She states that she is unemployed. Pt does not have a PCP. She denies fever, chills, nausea, vomiting, frequency, urgency, dysuria, and abdominal pain.  Past Medical History  Diagnosis Date  . OSA (obstructive sleep apnea)   . Narcolepsy   . RLS (restless legs syndrome)    History reviewed. No pertinent past surgical history. No family history on file. History  Substance Use Topics  . Smoking status: Current Every Day Smoker -- 0.50 packs/day for 8 years    Types: Cigarettes  . Smokeless tobacco: Never Used  . Alcohol Use: No   OB History    No data available     Review of Systems  Constitutional: Negative for fever and chills.   Gastrointestinal: Negative for nausea, vomiting, abdominal pain and diarrhea.  Genitourinary: Negative for dysuria, urgency and frequency.  Musculoskeletal: Positive for back pain.  All other systems reviewed and are negative.   Allergies  Diphenhydramine hcl and Ibuprofen  Home Medications   Prior to Admission medications   Medication Sig Start Date End Date Taking? Authorizing Provider  acetaminophen (TYLENOL) 500 MG tablet Take 500 mg by mouth every 6 (six) hours as needed for moderate pain or headache.     Historical Provider, MD  loperamide (IMODIUM) 2 MG capsule Take 1 capsule (2 mg total) by mouth 4 (four) times daily as needed for diarrhea or loose stools. 07/19/14   Emilia Beck, PA-C  methocarbamol (ROBAXIN) 500 MG tablet Take 1 tablet (500 mg total) by mouth 2 (two) times daily. 12/20/14   Tiffany Orlene Och, NP   BP 118/73 mmHg  Pulse 78  Temp(Src) 98.1 F (36.7 C) (Oral)  Resp 18  SpO2 100% Physical Exam  Constitutional: She is oriented to person, place, and time. She appears well-developed and well-nourished. No distress.  HENT:  Head: Normocephalic and atraumatic.  Right Ear: Tympanic membrane normal.  Left Ear: Tympanic membrane normal.  Nose: Nose normal.  Mouth/Throat: Uvula is midline, oropharynx is clear and moist and mucous membranes are normal.  Eyes: EOM are normal.  Neck: Normal range of motion. Neck supple.  Cardiovascular: Normal rate and regular rhythm.  Pulmonary/Chest: Effort normal. She has no wheezes. She has no rales.  Abdominal: Soft. Bowel sounds are normal. There is no tenderness.  Musculoskeletal: Normal range of motion.       Lumbar back: She exhibits tenderness, pain and spasm. She exhibits normal pulse.       Back:  Tender over right sciatic nerve. Muscle spasm in right lower lumbar area  Neurological: She is alert and oriented to person, place, and time. She has normal strength. No cranial nerve deficit or sensory deficit. Gait normal.   Reflex Scores:      Bicep reflexes are 2+ on the right side and 2+ on the left side.      Brachioradialis reflexes are 2+ on the right side and 2+ on the left side.      Patellar reflexes are 2+ on the right side and 2+ on the left side.      Achilles reflexes are 2+ on the right side and 2+ on the left side. ambulatory with steady gait, no foot drag  Skin: Skin is warm and dry.  Psychiatric: She has a normal mood and affect. Her behavior is normal.  Nursing note and vitals reviewed.   ED Course  Procedures  DIAGNOSTIC STUDIES: Oxygen Saturation is 97% on RA, normal by my interpretation.    COORDINATION OF CARE: 7:47 PM - Discussed plans to order medication for muscle spasm and inflammation. Will refer to orthopedist for follow up if symptoms worsen. Pt advised of plan for treatment and pt agrees.   MDM  29 y.o. female with hx of low back pain with this episode starting 3 days ago. Stable for d/c without neurovascular compromise. Discussed with the patient and all questioned fully answered. She will return if any problems arise.  Final diagnoses:  Sciatica, right   I personally performed the services described in this documentation, which was scribed in my presence. The recorded information has been reviewed and is accurate.    [address]Hope Pine Lakes AdditionM Allen, [place]NP 12/21/14 1608  Benjiman CoreNathan Pickering, MD 12/23/14 [phone]

## 2014-12-20 NOTE — ED Notes (Signed)
Pt c/o lower Back pain x 3 days. States she tried taking a bayer with no relief.

## 2014-12-20 NOTE — ED Notes (Signed)
Pt verbalizes understanding of d/c instructions and denies any further needs at this time. 

## 2015-09-23 ENCOUNTER — Emergency Department (HOSPITAL_COMMUNITY): Payer: Self-pay

## 2015-09-23 ENCOUNTER — Emergency Department (HOSPITAL_COMMUNITY)
Admission: EM | Admit: 2015-09-23 | Discharge: 2015-09-23 | Disposition: A | Discharge status: Home / Self Care | Payer: Self-pay | Attending: Emergency Medicine | Admitting: Emergency Medicine

## 2015-09-23 ENCOUNTER — Encounter (HOSPITAL_COMMUNITY): Payer: Self-pay | Admitting: Emergency Medicine

## 2015-09-23 DIAGNOSIS — R112 Nausea with vomiting, unspecified: Secondary | ICD-10-CM | POA: Insufficient documentation

## 2015-09-23 DIAGNOSIS — R1030 Lower abdominal pain, unspecified: Secondary | ICD-10-CM | POA: Insufficient documentation

## 2015-09-23 DIAGNOSIS — R55 Syncope and collapse: Secondary | ICD-10-CM | POA: Insufficient documentation

## 2015-09-23 DIAGNOSIS — Z3202 Encounter for pregnancy test, result negative: Secondary | ICD-10-CM | POA: Insufficient documentation

## 2015-09-23 DIAGNOSIS — E860 Dehydration: Secondary | ICD-10-CM | POA: Insufficient documentation

## 2015-09-23 DIAGNOSIS — R000 Tachycardia, unspecified: Secondary | ICD-10-CM | POA: Insufficient documentation

## 2015-09-23 DIAGNOSIS — Z8669 Personal history of other diseases of the nervous system and sense organs: Secondary | ICD-10-CM | POA: Insufficient documentation

## 2015-09-23 DIAGNOSIS — R197 Diarrhea, unspecified: Secondary | ICD-10-CM | POA: Insufficient documentation

## 2015-09-23 DIAGNOSIS — F17210 Nicotine dependence, cigarettes, uncomplicated: Secondary | ICD-10-CM | POA: Insufficient documentation

## 2015-09-23 DIAGNOSIS — M25562 Pain in left knee: Secondary | ICD-10-CM | POA: Insufficient documentation

## 2015-09-23 DIAGNOSIS — R6883 Chills (without fever): Secondary | ICD-10-CM | POA: Insufficient documentation

## 2015-09-23 DIAGNOSIS — R1110 Vomiting, unspecified: Secondary | ICD-10-CM

## 2015-09-23 LAB — I-STAT TROPONIN, ED: TROPONIN I, POC: 0 ng/mL (ref 0.00–0.08)

## 2015-09-23 LAB — BASIC METABOLIC PANEL
Anion gap: 9 (ref 5–15)
BUN: 16 mg/dL (ref 6–20)
CHLORIDE: 108 mmol/L (ref 101–111)
CO2: 21 mmol/L — AB (ref 22–32)
CREATININE: 0.97 mg/dL (ref 0.44–1.00)
Calcium: 8.2 mg/dL — ABNORMAL LOW (ref 8.9–10.3)
GFR calc non Af Amer: >60 mL/min (ref >60)
GLUCOSE: 101 mg/dL — AB (ref 65–99)
Potassium: 3.9 mmol/L (ref 3.5–5.1)
Sodium: 138 mmol/L (ref 135–145)

## 2015-09-23 LAB — COMPREHENSIVE METABOLIC PANEL
ALT: 21 U/L (ref 14–54)
AST: 40 U/L (ref 15–41)
Albumin: 3.5 g/dL (ref 3.5–5.0)
Alkaline Phosphatase: 88 U/L (ref 38–126)
Anion gap: 11 (ref 5–15)
BILIRUBIN TOTAL: 1.6 mg/dL — AB (ref 0.3–1.2)
BUN: 17 mg/dL (ref 6–20)
CO2: 21 mmol/L — ABNORMAL LOW (ref 22–32)
CREATININE: 1.08 mg/dL — AB (ref 0.44–1.00)
Calcium: 8.8 mg/dL — ABNORMAL LOW (ref 8.9–10.3)
Chloride: 103 mmol/L (ref 101–111)
Glucose, Bld: 101 mg/dL — ABNORMAL HIGH (ref 65–99)
POTASSIUM: 6.2 mmol/L — AB (ref 3.5–5.1)
Sodium: 135 mmol/L (ref 135–145)
TOTAL PROTEIN: 7.3 g/dL (ref 6.5–8.1)

## 2015-09-23 LAB — CBC WITH DIFFERENTIAL/PLATELET
BASOS ABS: 0 10*3/uL (ref 0.0–0.1)
Basophils Relative: 0 %
EOS PCT: 1 %
Eosinophils Absolute: 0.2 10*3/uL (ref 0.0–0.7)
HEMATOCRIT: 45.3 % (ref 36.0–46.0)
Hemoglobin: 14.9 g/dL (ref 12.0–15.0)
LYMPHS ABS: 1 10*3/uL (ref 0.7–4.0)
LYMPHS PCT: 8 %
MCH: 28.2 pg (ref 26.0–34.0)
MCHC: 32.9 g/dL (ref 30.0–36.0)
MCV: 85.6 fL (ref 78.0–100.0)
MONO ABS: 0.7 10*3/uL (ref 0.1–1.0)
MONOS PCT: 5 %
NEUTROS ABS: 10.6 10*3/uL — AB (ref 1.7–7.7)
Neutrophils Relative %: 86 %
Platelets: 255 10*3/uL (ref 150–400)
RBC: 5.29 MIL/uL — ABNORMAL HIGH (ref 3.87–5.11)
RDW: 14 % (ref 11.5–15.5)
WBC: 12.4 10*3/uL — ABNORMAL HIGH (ref 4.0–10.5)

## 2015-09-23 LAB — I-STAT BETA HCG BLOOD, ED (MC, WL, AP ONLY)

## 2015-09-23 LAB — URINALYSIS, ROUTINE W REFLEX MICROSCOPIC
BILIRUBIN URINE: NEGATIVE
GLUCOSE, UA: NEGATIVE mg/dL
Hgb urine dipstick: NEGATIVE
Ketones, ur: NEGATIVE mg/dL
Leukocytes, UA: NEGATIVE
NITRITE: NEGATIVE
PH: 7.5 (ref 5.0–8.0)
Protein, ur: NEGATIVE mg/dL
SPECIFIC GRAVITY, URINE: 1.019 (ref 1.005–1.030)

## 2015-09-23 LAB — LIPASE, BLOOD: Lipase: 30 U/L (ref 11–51)

## 2015-09-23 MED ORDER — PROMETHAZINE HCL 25 MG PO TABS: 25.0000 mg | ORAL_TABLET | Freq: Four times a day (QID) | ORAL | Status: DC | PRN

## 2015-09-23 MED ORDER — FENTANYL CITRATE (PF) 100 MCG/2ML IJ SOLN
25.0000 ug | Freq: Once | INTRAMUSCULAR | Status: AC
  Administered 2015-09-23: 25 ug via INTRAVENOUS
  Filled 2015-09-23: qty 2

## 2015-09-23 MED ORDER — ACETAMINOPHEN 500 MG PO TABS
1000.0000 mg | ORAL_TABLET | Freq: Once | ORAL | Status: AC
  Administered 2015-09-23: 1000 mg via ORAL
  Filled 2015-09-23: qty 2

## 2015-09-23 MED ORDER — SODIUM CHLORIDE 0.9 % IV BOLUS (SEPSIS)
1000.0000 mL | Freq: Once | INTRAVENOUS | Status: AC
  Administered 2015-09-23: 1000 mL via INTRAVENOUS

## 2015-09-23 MED ORDER — PROCHLORPERAZINE EDISYLATE 5 MG/ML IJ SOLN
10.0000 mg | Freq: Once | INTRAMUSCULAR | Status: AC
  Administered 2015-09-23: 10 mg via INTRAVENOUS
  Filled 2015-09-23: qty 2

## 2015-09-23 NOTE — ED Notes (Signed)
Pt arrives from home reporting nausea yesterday, vomiting and diarrhea beginning around 0100 today.  Pt reports LOC, coming to on floor.  Pt reports L knee pain.

## 2015-09-23 NOTE — Discharge Instructions (Signed)
Take phenergan for nausea or vomiting.   Stay hydrated.   Take tylenol, motrin for pain.   Take imodium as needed for diarrhea, up to 10 pills daily.   See your doctor.   Return to ER if you have worse vomiting, abdominal pain, fevers.

## 2015-09-23 NOTE — ED Provider Notes (Signed)
CSN: [id number]     Arrival date & time 09/23/15  0843 History   First MD Initiated Contact with Patient 09/23/15 [id number]     Chief Complaint  Patient presents with  . Loss of Consciousness  . Emesis     (Consider location/radiation/quality/duration/timing/severity/associated sxs/prior Treatment) The history is provided by the patient.  Tiffany Allen is a 30 y.o. female hx of narcolepsy, restless legs syndrome, Here presenting with nausea and vomiting and syncope. She woke up around 1 AM this morning and had about 4-5 episodes of vomiting and diarrhea. As diffuse abdominal cramps as well. She did notice that she may have passed out and woke up on the floor. Not sure if she hit her head are not but denies any headache currently. Denies any chest pain but does have some left knee pain. She has some subjective chills as well. Patient states that she is healthy otherwise just has a history of narcolepsy and restless leg syndrome. Patient denies any sick contacts.     Past Medical History  Diagnosis Date  . OSA (obstructive sleep apnea)   . Narcolepsy   . RLS (restless legs syndrome)    History reviewed. No pertinent past surgical history. No family history on file. Social History  Substance Use Topics  . Smoking status: Current Every Day Smoker -- 0.50 packs/day for 8 years    Types: Cigarettes  . Smokeless tobacco: Never Used  . Alcohol Use: No   OB History    No data available     Review of Systems  Cardiovascular: Positive for syncope.  Gastrointestinal: Positive for vomiting, abdominal pain and diarrhea.  All other systems reviewed and are negative.     Allergies  Diphenhydramine hcl and Ibuprofen  Home Medications   Prior to Admission medications   Not on File   BP 113/83 mmHg  Pulse 94  Temp(Src) 99.1 F (37.3 C) (Oral)  Resp 19  SpO2 99%  LMP 07/26/2015 (Approximate) Physical Exam  Constitutional: She is oriented to person, place, and time.  Dehydrated    HENT:  Head: Normocephalic.  MM dry   Eyes: Conjunctivae are normal. Pupils are equal, round, and reactive to light.  Neck: Normal range of motion. Neck supple.  Cardiovascular: Regular rhythm and normal heart sounds.   Tachycardic   Pulmonary/Chest: Effort normal and breath sounds normal. No respiratory distress. She has no wheezes. She has no rales.  Abdominal: Soft. Bowel sounds are normal.  Mild diffuse lower abdominal tenderness, no rebound   Musculoskeletal: Normal range of motion. She exhibits no edema or tenderness.  Neurological: She is alert and oriented to person, place, and time. No cranial nerve deficit. Coordination normal.  Skin: Skin is warm.  Psychiatric: She has a normal mood and affect. Her behavior is normal. Judgment and thought content normal.  Nursing note and vitals reviewed.   ED Course  Procedures (including critical care time) Labs Review Labs Reviewed  CBC WITH DIFFERENTIAL/PLATELET - Abnormal; Notable for the following:    WBC 12.4 (*)    RBC 5.29 (*)    Neutro Abs 10.6 (*)    All other components within normal limits  COMPREHENSIVE METABOLIC PANEL - Abnormal; Notable for the following:    Potassium 6.2 (*)    CO2 21 (*)    Glucose, Bld 101 (*)    Creatinine, Ser 1.08 (*)    Calcium 8.8 (*)    Total Bilirubin 1.6 (*)    All other components within normal limits  BASIC METABOLIC PANEL - Abnormal; Notable for the following:    CO2 21 (*)    Glucose, Bld 101 (*)    Calcium 8.2 (*)    All other components within normal limits  LIPASE, BLOOD  URINALYSIS, ROUTINE W REFLEX MICROSCOPIC (NOT AT [facility])  I-STAT TROPOININ, ED  I-STAT BETA HCG BLOOD, ED ([facility], [facility], [facility] ONLY)    Imaging Review Dg Knee Complete 4 Views Left  09/23/2015  CLINICAL DATA:  Pain after fall EXAM: LEFT KNEE - COMPLETE 4+ VIEW COMPARISON:  None. FINDINGS: There is no evidence of fracture, dislocation, or joint effusion. There is no evidence of arthropathy or other focal bone  abnormality. Soft tissues are unremarkable. IMPRESSION: Negative. Electronically Signed   By: Gerome Sam III M.D   On: 09/23/2015 10:01   Dg Abd Acute W/chest  09/23/2015  CLINICAL DATA:  Vomiting and abdominal pain. EXAM: DG ABDOMEN ACUTE W/ 1V CHEST COMPARISON:  July 01, 2013 FINDINGS: The heart, hila, mediastinum, lungs, and pleura are normal. No free air, portal venous gas, or pneumatosis. There is a paucity of bowel gas limiting evaluation but no evidence of obstruction. No renal or ureteral stones are identified. IMPRESSION: The paucity of bowel gas limits evaluation but no obstruction identified. No acute abnormalities. Electronically Signed   By: Gerome Sam III M.D   On: 09/23/2015 10:00   I have personally reviewed and evaluated these images and lab results as part of my medical decision-making.   EKG Interpretation   Date/Time:  Sunday September 23 2015 08:52:18 EDT Ventricular Rate:  128 PR Interval:  124 QRS Duration: 80 QT Interval:  299 QTC Calculation: 436 R Axis:   97 Text Interpretation:  Sinus tachycardia Borderline right axis deviation  Borderline T abnormalities, diffuse leads No previous ECGs available  Confirmed by Margarethe Virgen  MD, Alcus Bradly ([id number]) on 09/23/2015 9:07:43 AM      MDM   Final diagnoses:  None   Tiffany Allen is a 30 y.o. female here with lower abdominal pain, vomiting, diarrhea with syncope. Likely dehydration from gastro. Will get labs, UA. Will hydrate and reassess.   12:31 PM Labs showed WBC 12. xrays unremarkable. Abdomen nontender now. Given 2 L NS. Nausea controlled, tolerated PO fluids. Orthostatic initially and was tachy to 120s initially but now pulse 80-90s and BP nl. Initial CMP hemolyzed, but repeat showed nl K. Will dc home with phenergan prn.    Richardean Canal, MD 09/23/15 [phone]

## 2017-01-26 ENCOUNTER — Encounter: Payer: Self-pay | Admitting: Internal Medicine

## 2017-01-26 ENCOUNTER — Encounter: Payer: Self-pay | Admitting: *Deleted

## 2017-01-26 ENCOUNTER — Ambulatory Visit (INDEPENDENT_AMBULATORY_CARE_PROVIDER_SITE_OTHER): Payer: Self-pay | Admitting: Internal Medicine

## 2017-01-26 VITALS — BP 132/74 | HR 94 | Ht 62.0 in | Wt 279.8 lb

## 2017-01-26 DIAGNOSIS — G2581 Restless legs syndrome: Secondary | ICD-10-CM

## 2017-01-26 DIAGNOSIS — G4733 Obstructive sleep apnea (adult) (pediatric): Secondary | ICD-10-CM

## 2017-01-26 DIAGNOSIS — G47419 Narcolepsy without cataplexy: Secondary | ICD-10-CM

## 2017-01-26 NOTE — Patient Instructions (Signed)
Order- schedule split protocol NPSG followed thee next day by an MSLT     Dx OSA, Narcolepsy without Cataplexy  Please check with me before taking any medicines, especially anything that might cause sleepiness stimulation, in the week before your sleep studies  Please call if you have any questions

## 2017-01-26 NOTE — Progress Notes (Signed)
01/26/17- 31 year old female for sleep evaluation. 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  FOLLOWS FOR: Former [facility] patient for Narcolepsy as well. Had sleep study in past and had CPAP through [facility]-could not tolerate. Had been on Provigil in past as well and declined using Ritalin rx.  Epworth 22/24 Medical problem list includes obstructive sleep apnea, restless legs syndrome, narcolepsy without cataplexy NPSG 01/29/06- AHI 9/ hr, desaturation to 81%, + PLMs with arousal MSLT 1//5/04 - 2.4 minutes with 5 SOREMS Had seen Dr. Shelle Ironlance in 2013- Nuvigil seemed to cause loose stools. She was concerned about potential for cardiovascular side effects possible with Ritalin. At that time she was using CPAP but says she stopped because she didn't like the mask fit. Could not afford Requip. She has not been on treatment in several years. Apparently she has just been cut off food stamps and subsidized housing, precipitating visit today. She asks a letter saying she is under our care. Her primary complaint is variable daytime sleepiness, drifting between alertness and sleepiness throughout day and night. Bedtime around 10 PM, 30 minutes sleep latency, waking at least 5 times before up at 11 AM. ENT surgery-none She doesn't recognize much sleep disturbance related to limb movement now and denies symptoms of cataplexy. She does admit occasional definite sleep paralysis.  Past Medical History:  Diagnosis Date  . Narcolepsy   . OSA (obstructive sleep apnea)   . RLS (restless legs syndrome)    No past surgical history on file. Family History  Problem Relation Age of Onset  . Atrial fibrillation Mother   . Diabetes Paternal Grandmother    Social History   Social History  . Marital status: Single    Spouse name: N/A  . Number of children: N/A  . Years of education: N/A   Occupational History  . Not on file.   Social History Main Topics  . Smoking status: Current Every Day  Smoker    Packs/day: 0.50    Years: 8.00    Types: Cigarettes  . Smokeless tobacco: Never Used  . Alcohol use No  . Drug use: No  . Sexual activity: Yes   Other Topics Concern  . Not on file   Social History Narrative  . No narrative on file   ROS-see HPI   Negative unless "+" Constitutional:    weight loss, night sweats, fevers, chills, + fatigue, lassitude. HEENT:    headaches, difficulty swallowing, tooth/dental problems, sore throat,       sneezing, itching, ear ache, nasal congestion, post nasal drip, snoring CV:    chest pain, orthopnea, PND, swelling in lower extremities, anasarca,                                                          dizziness, palpitations Resp:   shortness of breath with exertion or at rest.                productive cough,   non-productive cough, coughing up of blood.              change in color of mucus.  wheezing.   Skin:    rash or lesions. GI:  No-   heartburn, indigestion, abdominal pain, nausea, vomiting, diarrhea,  change in bowel habits, loss of appetite GU: dysuria, change in color of urine, no urgency or frequency.   flank pain. MS:   joint pain, stiffness, decreased range of motion, back pain. Neuro-     nothing unusual Psych:  change in mood or affect.  depression or anxiety.   memory loss.  OBJ- Physical Exam General- Alert, Oriented, +drowsy/passive, Distress- none acute, + morbidly obese Skin- rash-none, lesions- none, excoriation- none Lymphadenopathy- none Head- atraumatic            Eyes- Gross vision intact, PERRLA, conjunctivae and secretions clear            Ears- Hearing, canals-normal            Nose- Clear, no-Septal dev, mucus, polyps, erosion, perforation             Throat- Mallampati IV , mucosa clear , drainage- none, tonsils- atrophic (+ tongue and lip studs) Neck- flexible , trachea midline, no stridor , thyroid nl, carotid no bruit Chest - symmetrical excursion , unlabored           Heart/CV- RRR  , no murmur , no gallop  , no rub, nl s1 s2                           - JVD- none , edema- none, stasis changes- none, varices- none           Lung- clear to P&A, wheeze- none, cough- none , dullness-none, rub- none           Chest wall-  Abd-  Br/ Gen/ Rectal- Not done, not indicated Extrem- cyanosis- none, clubbing, none, atrophy- none, strength- nl Neuro- grossly intact to observation

## 2017-01-27 NOTE — Assessment & Plan Note (Signed)
Weight loss would certainly help the sleep apnea problem and her diabetes. I doubt we will see any progress is direction until we can improve the daytime sleepiness enough that she can find motivation. Consider potential bariatric referral.

## 2017-01-27 NOTE — Assessment & Plan Note (Signed)
She says this problem has not been significant recently. We will watch need to address it.

## 2017-01-27 NOTE — Assessment & Plan Note (Signed)
Very mild obstructive apnea is a common finding in patients with narcolepsy. Persistent daytime somnolence is a common finding in patients with obstructive sleep apnea even if appropriately treated. We need to redefine diagnostic data to confirm narcolepsy as a separate diagnosis. Plan-we're scheduling a split protocol NPSG, to be followed by an MSLT. I think the precipitating reason that she came to us for this visit was because her disability payments and housing subsidy are being challenged.

## 2017-01-27 NOTE — Assessment & Plan Note (Signed)
On her original sleep study, 11 years ago, she had only mild obstructive sleep apnea and did not tolerate CPAP. She is morbidly obese and has a Mallampati IV palate spacing certainly consistent with OSA. We need to redefine this status. Plan-schedule sleep study

## 2017-03-03 ENCOUNTER — Ambulatory Visit (HOSPITAL_BASED_OUTPATIENT_CLINIC_OR_DEPARTMENT_OTHER): Payer: Self-pay | Attending: Internal Medicine | Admitting: Internal Medicine

## 2017-03-03 VITALS — Ht 62.0 in | Wt 281.0 lb

## 2017-03-03 DIAGNOSIS — I493 Ventricular premature depolarization: Secondary | ICD-10-CM | POA: Insufficient documentation

## 2017-03-03 DIAGNOSIS — G4733 Obstructive sleep apnea (adult) (pediatric): Secondary | ICD-10-CM

## 2017-03-03 DIAGNOSIS — G47419 Narcolepsy without cataplexy: Secondary | ICD-10-CM | POA: Insufficient documentation

## 2017-03-03 DIAGNOSIS — R0683 Snoring: Secondary | ICD-10-CM | POA: Insufficient documentation

## 2017-03-04 ENCOUNTER — Ambulatory Visit (HOSPITAL_BASED_OUTPATIENT_CLINIC_OR_DEPARTMENT_OTHER): Payer: Self-pay | Attending: Internal Medicine | Admitting: Internal Medicine

## 2017-03-04 DIAGNOSIS — G47419 Narcolepsy without cataplexy: Secondary | ICD-10-CM | POA: Insufficient documentation

## 2017-03-04 DIAGNOSIS — G4733 Obstructive sleep apnea (adult) (pediatric): Secondary | ICD-10-CM

## 2017-03-07 DIAGNOSIS — G47419 Narcolepsy without cataplexy: Secondary | ICD-10-CM

## 2017-03-07 NOTE — Procedures (Signed)
    Patient Name: Tiffany Allen, Tiffany Allen Date: 03/04/2017 Gender: Female D.O.B: 08-Feb-1986 Age (years): 84 Referring Provider: Jetty Duhamel MD, ABSM Height (inches): 62 Interpreting Physician: Jetty Duhamel MD, ABSM Weight (lbs): 281 RPSGT: [place] BMI: 51 MRN: [id number] Neck Size: 17.00 CLINICAL INFORMATION Sleep Study Type: MSLT  The patient was referred to the sleep center for evaluation of daytime sleepiness.  Epworth Sleepiness Score: 20  SLEEP STUDY TECHNIQUE A Multiple Sleep Latency Test was performed after an overnight polysomnogram according to the AASM scoring manual v2.3 (April 2016) and clinical guidelines. Five nap opportunities occurred over the course of the test which followed an overnight polysomnogram. The channels recorded and monitored were frontal, central, and occipital electroencephalography (EEG), right and left electrooculogram (EOG), chin electromyography (EMG), and electrocardiogram (EKG).  MEDICATIONS Medications taken by the patient : none reported Medications administered by patient during sleep study : No sleep medicine administered.  IMPRESSIONS - Total number of naps attempted: 5.00 . Total number of naps with sleep attained: 5.0. The Mean Sleep Latency was 0:04 minutes. There were 2 sleep-onset REM periods. - The patient appears to have pathologic sleepiness, evidenced by a short mean sleep latency (8 minutes or less) on this MSLT. - 2 sleep onset REMs were noted during this MSLT.  DIAGNOSIS - Narcolepsy (347.0 [G47.419 ICD-10])  RECOMMENDATIONS - Manage as Narcolepsy  [Electronically signed] 03/07/2017 12:15 PM  Jetty Duhamel MD, ABSM Diplomate, American Board of Sleep Medicine   NPI: [id number]  Waymon Budge Diplomate, American Board of Sleep Medicine  ELECTRONICALLY SIGNED ON:  03/07/2017, 12:10 PM [facility] PH: [phone]   FX: [phone] ACCREDITED BY THE AMERICAN ACADEMY OF  SLEEP MEDICINE

## 2017-03-07 NOTE — Procedures (Signed)
  Patient Name: Tiffany Allen, Fullen Date: 03/03/2017 Gender: Female D.O.B: 03/22/1986 Age (years): 89 Referring Provider: Jetty Duhamel MD, ABSM Height (inches): 62 Interpreting Physician: Jetty Duhamel MD, ABSM Weight (lbs): 281 RPSGT: [place] BMI: 51 MRN: [id number] Neck Size: 17.00 CLINICAL INFORMATION Sleep Study Type: NPSG  Indication for sleep study: Obesity, OSA  Epworth Sleepiness Score: 20   Most recent polysomnogram dated 01/29/2017 revealed an AHI of 9/h. SLEEP STUDY TECHNIQUE As per the AASM Manual for the Scoring of Sleep and Associated Events v2.3 (April 2016) with a hypopnea requiring 4% desaturations.  The channels recorded and monitored were frontal, central and occipital EEG, electrooculogram (EOG), submentalis EMG (chin), nasal and oral airflow, thoracic and abdominal wall motion, anterior tibialis EMG, snore microphone, electrocardiogram, and pulse oximetry.  MEDICATIONS Medications self-administered by patient taken the night of the study : none reported  SLEEP ARCHITECTURE The study was initiated at 10:33:19 PM and ended at 5:55:41 AM.  Sleep onset time was 4.8 minutes and the sleep efficiency was 59.6%. The total sleep time was 263.5 minutes.  Stage REM latency was 62.0 minutes.  The patient spent 9.87% of the night in stage N1 sleep, 43.64% in stage N2 sleep, 30.74% in stage N3 and 15.75% in REM.  Alpha intrusion was absent.  Supine sleep was 32.30%.  RESPIRATORY PARAMETERS The overall apnea/hypopnea index (AHI) was 0.5 per hour. There were 0 total apneas, including 0 obstructive, 0 central and 0 mixed apneas. There were 2 hypopneas and 8 RERAs.  The AHI during Stage REM sleep was 0.0 per hour.  AHI while supine was 0.0 per hour.  The mean oxygen saturation was 94.67%. The minimum SpO2 during sleep was 88.00%.  Soft snoring was noted during this study.  CARDIAC DATA The 2 lead EKG demonstrated sinus rhythm. The mean heart rate  was 78.67 beats per minute. Other EKG findings include: PVCs.  LEG MOVEMENT DATA The total PLMS were 5 with a resulting PLMS index of 1.14. Associated arousal with leg movement index was 0.0 .  IMPRESSIONS - No significant obstructive sleep apnea occurred during this study (AHI = 0.5/h). - No significant central sleep apnea occurred during this study (CAI = 0.0/h). - Oxygen desaturation was noted during this study (Min O2 = 88.00%, Mean 94.6%). - The patient snored with Soft snoring volume. - EKG findings include PVCs. - Clinically significant periodic limb movements did not occur during sleep. No significant associated arousals.  DIAGNOSIS - Primary Snoring (786.09 [R06.83 ICD-10])  RECOMMENDATIONS - Be careful with alcohol, sedatives and other CNS depressants that may worsen sleep apnea and disrupt normal sleep architecture. - Sleep hygiene should be reviewed to assess factors that may improve sleep quality. - Weight management and regular exercise should be initiated or continued if appropriate. - See result of MSLT following this study.  [Electronically signed] 03/07/2017 12:06 PM  Jetty Duhamel MD, ABSM Diplomate, American Board of Sleep Medicine   NPI: [id number]  Waymon Budge Diplomate, American Board of Sleep Medicine  ELECTRONICALLY SIGNED ON:  03/07/2017, 12:02 PM  [facility] PH: [phone]   FX: [phone] ACCREDITED BY THE AMERICAN ACADEMY OF SLEEP MEDICINE

## 2017-04-30 ENCOUNTER — Ambulatory Visit (INDEPENDENT_AMBULATORY_CARE_PROVIDER_SITE_OTHER): Payer: Self-pay | Admitting: Internal Medicine

## 2017-04-30 ENCOUNTER — Encounter: Payer: Self-pay | Admitting: Internal Medicine

## 2017-04-30 VITALS — BP 114/78 | HR 94 | Ht 62.0 in | Wt 283.4 lb

## 2017-04-30 DIAGNOSIS — G4733 Obstructive sleep apnea (adult) (pediatric): Secondary | ICD-10-CM

## 2017-04-30 DIAGNOSIS — G47419 Narcolepsy without cataplexy: Secondary | ICD-10-CM

## 2017-04-30 MED ORDER — MODAFINIL 100 MG PO TABS: ORAL_TABLET | ORAL | 2 refills | Status: DC

## 2017-04-30 NOTE — Assessment & Plan Note (Signed)
She is convinced that she does not want Ritalin or Adderall Try modafinil as carefully discussed.  She is given printed patient information on treatment of narcolepsy

## 2017-04-30 NOTE — Progress Notes (Signed)
Patient ID: Tiffany Allen, female   DOB: 1985/08/11, 31 y.o.   MRN: [id number]  01/26/17- 31 year old female for sleep evaluation. 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  FOLLOWS FOR: Former [facility] patient for Narcolepsy as well. Had sleep study in past and had CPAP through [facility]-could not tolerate. Had been on Provigil in past as well and declined using Ritalin rx.  Epworth 22/24 Medical problem list includes obstructive sleep apnea, restless legs syndrome, narcolepsy without cataplexy NPSG 01/29/06- AHI 9/ hr, desaturation to 81%, + PLMs with arousal MSLT 1//5/04 - 2.4 minutes with 5 SOREMS Had seen Dr. Shelle Ironlance in 2013- Nuvigil seemed to cause loose stools. She was concerned about potential for cardiovascular side effects possible with Ritalin. At that time she was using CPAP but says she stopped because she didn't like the mask fit. Could not afford Requip. She has not been on treatment in several years. Apparently she has just been cut off food stamps and subsidized housing, precipitating visit today. She asks a letter saying she is under our care. Her primary complaint is variable daytime sleepiness, drifting between alertness and sleepiness throughout day and night. Bedtime around 10 PM, 30 minutes sleep latency, waking at least 5 times before up at 11 AM. ENT surgery-none She doesn't recognize much sleep disturbance related to limb movement now and denies symptoms of cataplexy. She does admit occasional definite sleep paralysis.  04/30/17- 31 yoF smoker followed for Narcolepsy located by obesity --Pt is no bettter since last visit 3 months ago, patient is not sleeping at all each night U difficulty maintaining wakefulness in the daytime and sleep at night.  No cataplexy.  Viewed her sleep study, confirming absence of significant apnea at night.  Time study confirmed severe somnolence consistent with narcolepsy. NPSG 03/03/17-  AHI 0.5/ hr, desaturation to a nadir  of88%.281 lbs. MSLT- 03/04/17  Mean latency 0:04 minutes, slept 5/5 naps, SOREM 2/5 naps She is firmly against trying Ritalin or Adderall because she thinks Dr Shelle Ironlance she would become dependent on these and that they had other unacceptable side effects.  Education effort made. She is willing to try modafinil.  ROS-see HPI  + = positive Constitutional:    weight loss, night sweats, fevers, chills, + fatigue, lassitude. HEENT:    headaches, difficulty swallowing, tooth/dental problems, sore throat,       sneezing, itching, ear ache, nasal congestion, post nasal drip, snoring CV:    chest pain, orthopnea, PND, swelling in lower extremities, anasarca,                                                        dizziness, palpitations Resp:   shortness of breath with exertion or at rest.                productive cough,   non-productive cough, coughing up of blood.              change in color of mucus.  wheezing.   Skin:    rash or lesions. GI:  No-   heartburn, indigestion, abdominal pain, nausea, vomiting, diarrhea,                 change in bowel habits, loss of appetite GU: dysuria, change in color of urine, no urgency or frequency.   flank  pain. MS:   joint pain, stiffness, decreased range of motion, back pain. Neuro-     nothing unusual Psych:  change in mood or affect.  depression or anxiety.   memory loss.  OBJ- Physical Exam General- Alert, Oriented, +drowsy/passive, Distress- none acute, + morbidly obese Skin- rash-none, lesions- none, excoriation- none Lymphadenopathy- none Head- atraumatic            Eyes- Gross vision intact, PERRLA, conjunctivae and secretions clear            Ears- Hearing, canals-normal            Nose- Clear, no-Septal dev, mucus, polyps, erosion, perforation             Throat- Mallampati IV , mucosa clear , drainage- none, tonsils- atrophic (+ tongue and lip studs) Neck- flexible , trachea midline, no stridor , thyroid nl, carotid no bruit Chest - symmetrical  excursion , unlabored           Heart/CV- RRR , no murmur , no gallop  , no rub, nl s1 s2                           - JVD- none , edema- none, stasis changes- none, varices- none           Lung- clear to P&A, wheeze- none, cough- none , dullness-none, rub- none           Chest wall-  Abd-  Br/ Gen/ Rectal- Not done, not indicated Extrem- cyanosis- none, clubbing, none, atrophy- none, strength- nl Neuro- grossly intact to observation

## 2017-04-30 NOTE — Patient Instructions (Addendum)
Script printed for modafinil to take one or two in the morning if needed for alertness Naps when needed  Please call as needed

## 2017-04-30 NOTE — Assessment & Plan Note (Signed)
Her obesity, she does not have significant obstructive sleep apnea and this cannot be the explanation for her significant daytime sleepiness.  Weight loss is encouraged

## 2017-08-05 ENCOUNTER — Other Ambulatory Visit: Payer: Self-pay

## 2017-08-05 ENCOUNTER — Encounter (HOSPITAL_COMMUNITY): Payer: Self-pay | Admitting: Emergency Medicine

## 2017-08-05 ENCOUNTER — Emergency Department (HOSPITAL_COMMUNITY)
Admission: EM | Admit: 2017-08-05 | Discharge: 2017-08-06 | Disposition: A | Discharge status: Home / Self Care | Payer: Self-pay | Attending: Emergency Medicine | Admitting: Emergency Medicine

## 2017-08-05 DIAGNOSIS — J069 Acute upper respiratory infection, unspecified: Secondary | ICD-10-CM

## 2017-08-05 DIAGNOSIS — F17210 Nicotine dependence, cigarettes, uncomplicated: Secondary | ICD-10-CM | POA: Insufficient documentation

## 2017-08-05 DIAGNOSIS — J029 Acute pharyngitis, unspecified: Secondary | ICD-10-CM

## 2017-08-05 LAB — RAPID STREP SCREEN (MED CTR MEBANE ONLY): Streptococcus, Group A Screen (Direct): NEGATIVE

## 2017-08-05 NOTE — ED Triage Notes (Signed)
Pt presents to ED for assessment of sore throat, sweats, nasal drainage and generalized malaise since Sunday.  States she feels one of her tonsils is swollen.  Patient states she has been having nose bleed "every time my fever comes on" and that they last approx 20-30 minutes at a time.

## 2017-08-05 NOTE — ED Provider Notes (Signed)
[facility] EMERGENCY DEPARTMENT Provider Note   CSN: [id number] Arrival date & time: 08/05/17  2213     History   Chief Complaint Chief Complaint  Patient presents with  . Sore Throat    HPI Tiffany Allen is a 32 y.o. female presenting for evaluation of nasal congestion, sore throat, and fevers.  Pt states for the past 3 days, she has had nasal congestion, sore throat, fevers, and intermittent nosebleeds.  She has associated sinus headaches/pain.  She has not taken anything for her symptoms including tylenol or ibuprofen. She has not taken her temperature.  She denies trismus or muffled voice.  She denies sick contacts.  She denies ear pain, eye pain, chest pain, shortness of breath, cough, nausea, vomiting, abdominal pain, urinary symptoms, abnormal bowel movements.  She has no other medical problems, does not take medications daily.  No history of asthma or COPD.  She is requesting a pregnancy test.  HPI  Past Medical History:  Diagnosis Date  . Narcolepsy   . OSA (obstructive sleep apnea)   . RLS (restless legs syndrome)     Patient Active Problem List   Diagnosis Date Noted  . Morbid obesity due to excess calories (HCC) 01/27/2017  . OBSTRUCTIVE SLEEP APNEA 03/29/2008  . RESTLESS LEGS SYNDROME 03/29/2008  . 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 No pertinent surgical history.  OB History    No data available       Home Medications    Prior to Admission medications   Medication Sig Start Date End Date Taking? Authorizing Provider  fluticasone (FLONASE) 50 MCG/ACT nasal spray Place 2 sprays into both nostrils daily. 08/06/17   Jude Naclerio, PA-C  modafinil (PROVIGIL) 100 MG tablet 1 or 2 daily as needed 04/30/17   Jetty Duhamel D, MD  phenol (CHLORASEPTIC) 1.4 % LIQD Use as directed 1 spray in the mouth or throat as needed for throat irritation / pain. 08/06/17   Octavio Matheney, PA-C    Family History Family  History  Problem Relation Age of Onset  . Atrial fibrillation Mother   . Diabetes Paternal Grandmother     Social History Social History   Tobacco Use  . Smoking status: Current Every Day Smoker    Packs/day: 0.50    Years: 8.00    Pack years: 4.00    Types: Cigarettes  . Smokeless tobacco: Never Used  Substance Use Topics  . Alcohol use: No  . Drug use: No     Allergies   Adhesive [tape]; Diphenhydramine hcl; and Ibuprofen   Review of Systems Review of Systems  Constitutional: Positive for fever.  HENT: Positive for congestion, sinus pressure, sinus pain and sore throat. Negative for trouble swallowing and voice change.   Respiratory: Negative for cough.   Cardiovascular: Negative for chest pain.  Gastrointestinal: Negative for abdominal pain, nausea and vomiting.     Physical Exam Updated Vital Signs BP (!) 150/103 (BP Location: Right Arm)   Pulse (!) 106   Temp 98.7 F (37.1 C) (Oral)   Resp 14   SpO2 100%   Physical Exam  Constitutional: She is oriented to person, place, and time. She appears well-developed and well-nourished. No distress.  HENT:  Head: Normocephalic and atraumatic.  Right Ear: Tympanic membrane, external ear and ear canal normal.  Left Ear: Tympanic membrane, external ear and ear canal normal.  Nose: Mucosal edema present. Right sinus exhibits no maxillary sinus tenderness and no frontal sinus tenderness.  Left sinus exhibits no maxillary sinus tenderness and no frontal sinus tenderness.  Mouth/Throat: Uvula is midline and mucous membranes are normal. Posterior oropharyngeal erythema present. No oropharyngeal exudate or tonsillar abscesses. Tonsils are 2+ on the right. Tonsils are 2+ on the left. No tonsillar exudate.  Nasal mucosal edema.  OP mildly erythematous with bilateral tonsillar swelling.  No exudate or sign of peritonsillar abscess.  Uvula midline with equal palate rise.  No trismus.  No muffled voice.  Handling secretions easily.   No tenderness to palpation of the sinuses.  TMs nonerythematous and not bulging bilaterally.  Eyes: Conjunctivae and EOM are normal. Pupils are equal, round, and reactive to light.  Neck: Normal range of motion.  Cardiovascular: Normal rate, regular rhythm and intact distal pulses.  Mildly tachycardic ~105  Pulmonary/Chest: Effort normal and breath sounds normal. She has no decreased breath sounds. She has no wheezes. She has no rhonchi. She has no rales.  Pt speaking in full sentences without difficulty. Clear lung sounds in all fields  Abdominal: Soft. She exhibits no distension and no mass. There is no tenderness. There is no guarding.  Musculoskeletal: Normal range of motion.  Lymphadenopathy:    She has no cervical adenopathy.  Neurological: She is alert and oriented to person, place, and time.  Skin: Skin is warm.  Psychiatric: She has a normal mood and affect.  Nursing note and vitals reviewed.    ED Treatments / Results  Labs (all labs ordered are listed, but only abnormal results are displayed) Labs Reviewed  RAPID STREP SCREEN (NOT AT [facility])  CULTURE, GROUP A STREP ([facility])  POC URINE PREG, ED    EKG  EKG Interpretation None       Radiology No results found.  Procedures Procedures (including critical care time)  Medications Ordered in ED Medications - No data to display   Initial Impression / Assessment and Plan / ED Course  I have reviewed the triage vital signs and the nursing notes.  Pertinent labs & imaging results that were available during my care of the patient were reviewed by me and considered in my medical decision making (see chart for details).     Patient presenting with 3 day h/o URI symptoms.  Physical exam reassuring, patient is afebrile and appears nontoxic.  Pulmonary exam reassuring.  Doubt pneumonia or peritonsillar abscess. Rapid strep negative. Urine preg negative. Likely viral URI.  Will treat symptomatically. At this time, patient  appears safe for discharge.  Return precautions given.  Patient states she understands and agrees to plan.   Final Clinical Impressions(s) / ED Diagnoses   Final diagnoses:  Upper respiratory tract infection, unspecified type  Pharyngitis, unspecified etiology    ED Discharge Orders        Ordered    fluticasone (FLONASE) 50 MCG/ACT nasal spray  Daily     08/06/17 0006    phenol (CHLORASEPTIC) 1.4 % LIQD  As needed     08/06/17 0006       Alveria ApleyCaccavale, Shawnese Magner, PA-C 08/06/17 0007    Raeford RazorKohut, Stephen, MD 08/06/17 [phone]

## 2017-08-05 NOTE — Discharge Instructions (Signed)
You likely have a viral illness.  This should be treated symptomatically. Use Tylenol or ibuprofen as needed for fevers or body aches. Use Flonase daily for nasal congestion. Use sore throat spray as needed.  Make sure you stay well-hydrated with water. Wash your hands frequently to prevent spread of infection. Return to the emergency room if you develop chest pain, difficulty breathing, or any new or worsening symptoms.

## 2017-08-06 LAB — POC URINE PREG, ED: Preg Test, Ur: NEGATIVE

## 2017-08-06 MED ORDER — FLUTICASONE PROPIONATE 50 MCG/ACT NA SUSP: 2.0000 | Freq: Every day | NASAL | 0 refills | Status: DC

## 2017-08-06 MED ORDER — PHENOL 1.4 % MT LIQD: 1.0000 | OROMUCOSAL | 0 refills | Status: DC | PRN

## 2017-08-08 LAB — CULTURE, GROUP A STREP (THRC)

## 2017-10-27 ENCOUNTER — Telehealth: Payer: Self-pay | Admitting: Internal Medicine

## 2017-10-27 NOTE — Telephone Encounter (Signed)
Spoke with pt, she states she needs a letter for social services so she can receive her benefits. They need a renewal for her records stating her condition. CY can we compose a ;letter?  Current Outpatient Medications on File Prior to Visit  Medication Sig Dispense Refill  . fluticasone (FLONASE) 50 MCG/ACT nasal spray Place 2 sprays into both nostrils daily. 16 g 0  . modafinil (PROVIGIL) 100 MG tablet 1 or 2 daily as needed 60 tablet 2  . phenol (CHLORASEPTIC) 1.4 % LIQD Use as directed 1 spray in the mouth or throat as needed for throat irritation / pain. 177 mL 0   No current facility-administered medications on file prior to visit.    Allergies  Allergen Reactions  . Adhesive [Tape]   . Diphenhydramine Hcl     throat swelling  . Ibuprofen Swelling

## 2017-10-27 NOTE — Telephone Encounter (Signed)
Could you just say " She is followed at this office for a diagnosis of Narcolepsy without Cataplexy, based on formal sleep testing."

## 2017-10-27 NOTE — Telephone Encounter (Signed)
Letter typed, signed, and placed in outgoing mail to pt's address.  Pt aware. Nothing further needed.

## 2018-02-12 ENCOUNTER — Emergency Department (HOSPITAL_COMMUNITY)
Admission: EM | Admit: 2018-02-12 | Discharge: 2018-02-12 | Disposition: A | Discharge status: Home / Self Care | Payer: Self-pay | Attending: Emergency Medicine | Admitting: Emergency Medicine

## 2018-02-12 ENCOUNTER — Other Ambulatory Visit: Payer: Self-pay

## 2018-02-12 ENCOUNTER — Emergency Department (HOSPITAL_COMMUNITY): Payer: Self-pay

## 2018-02-12 ENCOUNTER — Encounter (HOSPITAL_COMMUNITY): Payer: Self-pay | Admitting: *Deleted

## 2018-02-12 DIAGNOSIS — Z3202 Encounter for pregnancy test, result negative: Secondary | ICD-10-CM | POA: Insufficient documentation

## 2018-02-12 DIAGNOSIS — F17210 Nicotine dependence, cigarettes, uncomplicated: Secondary | ICD-10-CM | POA: Insufficient documentation

## 2018-02-12 DIAGNOSIS — J069 Acute upper respiratory infection, unspecified: Secondary | ICD-10-CM | POA: Insufficient documentation

## 2018-02-12 DIAGNOSIS — R300 Dysuria: Secondary | ICD-10-CM | POA: Insufficient documentation

## 2018-02-12 DIAGNOSIS — Z79899 Other long term (current) drug therapy: Secondary | ICD-10-CM | POA: Insufficient documentation

## 2018-02-12 DIAGNOSIS — B9789 Other viral agents as the cause of diseases classified elsewhere: Secondary | ICD-10-CM

## 2018-02-12 LAB — URINALYSIS, ROUTINE W REFLEX MICROSCOPIC
Bilirubin Urine: NEGATIVE
Glucose, UA: NEGATIVE mg/dL
Ketones, ur: NEGATIVE mg/dL
Leukocytes, UA: NEGATIVE
Nitrite: NEGATIVE
PROTEIN: NEGATIVE mg/dL
Specific Gravity, Urine: 1.017 (ref 1.005–1.030)
pH: 5 (ref 5.0–8.0)

## 2018-02-12 LAB — POC URINE PREG, ED: PREG TEST UR: NEGATIVE

## 2018-02-12 LAB — GROUP A STREP BY PCR: Group A Strep by PCR: NOT DETECTED

## 2018-02-12 MED ORDER — CEPHALEXIN 500 MG PO CAPS: 500.0000 mg | ORAL_CAPSULE | Freq: Two times a day (BID) | ORAL | 0 refills | Status: AC

## 2018-02-12 MED ORDER — BENZONATATE 100 MG PO CAPS: 100.0000 mg | ORAL_CAPSULE | Freq: Three times a day (TID) | ORAL | 0 refills | Status: DC

## 2018-02-12 NOTE — ED Triage Notes (Signed)
Pt in c/o cough, chills, body aches, intermittent fevers, also having trouble urinating despite drinking lots of fluid, no distress noted

## 2018-02-12 NOTE — Discharge Instructions (Addendum)
Please return to the Emergency Department for any new or worsening symptoms or if your symptoms do not improve. Please be sure to follow up with your Primary Care Physician as soon as possible regarding your visit today. If you do not have a Primary Doctor please use the resources below to establish one. Your urine today may have shown a urinary tract infection.  Please take all of the antibiotic Keflex as prescribed.  Your urine has been sent for culture, you will be contacted by [facility] if it grows out bacteria that are not treated by the above antibiotic. You may use the benzonatate to help with your cough. Please drink plenty of water and get plenty of rest to help with your upper respiratory tract infection. You may use Tylenol as directed on the packaging to help with your symptoms.  Contact a health care provider if: You are getting worse rather than better. Your symptoms are not controlled by medicine. You have chills. You have worsening shortness of breath. You have brown or red mucus. You have yellow or brown nasal discharge. You have pain in your face, especially when you bend forward. You have a fever. You have swollen neck glands. You have pain while swallowing. You have white areas in the back of your throat. Get help right away if: You have severe or persistent: Headache. Ear pain. Sinus pain. Chest pain. You have chronic lung disease and any of the following: Wheezing. Prolonged cough. Coughing up blood. A change in your usual mucus. You have a stiff neck. You have changes in your: Vision. Hearing. Thinking. Mood. Contact a health care provider if: You develop pain in your back or sides. You have a fever. You have nausea or vomiting. You have blood in your urine. You are not urinating as often as you usually do. Get help right away if: You pain is severe and not relieved with medicines. You are unable to hold down any fluids. You or someone else notices a  change in your mental function. You have a rapid heartbeat at rest. You have shaking or chills. You feel extremely weak.  RESOURCE GUIDE  Chronic Pain Problems: Contact [facility]  [phone] Patients need to be referred by their primary care doctor.  Insufficient Money for Medicine: Contact United Way:  call "211" or [facility] [phone].  No Primary Care Doctor: Call [facility] Connect  [phone] - can help you locate a primary care doctor that  accepts your insurance, provides certain services, etc. Physician Referral [phone]- [phone]  Agencies that provide inexpensive medical care: [facility]  [phone] [facility]  [phone] [facility]  [phone] [facility]  [phone] [facility]  [phone] [facility]  [phone]  Medicaid-accepting [facility] Providers: [facility]- [address], [address]  [phone], Mon-Fri 9am-7pm, Sat 9am-1pm [facility]- [address], [address] [place]  [phone] [facility]- [address], [address] [place]  [phone] [facility]- [address]  [phone] Renaye RakersVeita Bland- [address], [address], [phone]  Only accepts [place] patients after they have their name  applied to their card  Self Pay (no insurance) in [facility]: Sickle Cell Patients: Dr Willey BladeEric Dean, [facility]  [address], [phone] [facility]- [address]  [phone]       -  [facility]- [address]       -     [facility]- see information above (Speak to [employer] if you do not have insurance)       -  [facility]- [address] [place], [phone]       -  [facility]- [address],  [phone]       -  [facility]- [address] [phone]       -  Dr  Julio Sicks-  [address], [place], [phone]       -  [facility]- [address] [phone]       -  [facility]- [address] [phone], also [address], [phone]       -    [facility]- [address] [place], [phone], 1st & 3rd Saturday   every month, 10am-1pm  1) Find a Doctor and Pay Out of Pocket Although you won't have to find out who is covered by your insurance plan, it is a good idea to ask around and get recommendations. You will then need to call the office and see if the doctor you have chosen will accept you as a new patient and what types of options they offer for patients who are self-pay. Some doctors offer discounts or will set up payment plans for their patients who do not have insurance, but you will need to ask so you aren't surprised when you get to your appointment.  2) Contact Your Local Health Department Not all health departments have doctors that can see patients for sick visits, but many do, so it is worth a call to see if yours does. If you don't know where your local health department is, you can check in your phone book. The CDC also has a tool to help you locate your state's health department, and many state websites also have listings of all of their local health departments.  3) Find a Walk-in Clinic If your illness is not likely to be very severe or complicated, you may want to try a walk in clinic. These are popping up all over the country in pharmacies, drugstores, and shopping centers. They're usually staffed by nurse practitioners or physician assistants that have been trained to treat common illnesses and complaints. They're usually fairly quick and inexpensive. However, if you have serious medical issues or chronic medical problems, these are probably not your best option  STD Testing [facility] Department of [facility], STD Clinic, [address] [place], phone  [phone] or [phone].  Monday - Friday, call for an appointment. [facility] Department of [employer], STD Clinic, [place] [address], [place], phone [phone] or [phone].  Monday - Friday, call for an appointment.  Abuse/Neglect: [facility] Child Abuse Hotline 580-639-0930 [facility] Child Abuse Hotline (650) [phone] (After Hours)  Emergency Shelter:  [facility] 626-498-9112  Maternity Homes: Room at the [place] [facility] 714-296-1296 [facility] [phone]  MRSA Hotline #:   801-330-5602  [facility] Resources  [facility] [place]  United Way [facility] Dept. [address]                 [address]         [address] [place] [address] [place]  Blondell Reveal Phone:  [phone]                                  Phone:  [phone]                   Phone:  [phone]  [facility], [phone] [facility]- [phone]       -     [facility] in [place], [address]                                  [phone], [facility] Child Abuse Hotline 279-325-4809 or 440-551-9004 (After Hours)   Behavioral Health Services  Substance Abuse Resources: [facility] Drug [facility]  319-296-6968 [facility] 848-126-1262 [facility] [place] [phone] Floydene Flock [phone] Residential & Outpatient Substance Abuse Program  9721514931  Psychological Services: [facility] Health  [phone] [facility] Services  [phone] [facility], [phone] [place]. [address], [place], ACCESS LINE: [phone] or [phone], [employer]ur[employer]an.co.[email or web address]  Dental Assistance  If unable to pay or uninsured, contact:  [facility] or [facility]. to become qualified for the adult dental clinic.  Patients with Medicaid: [facility] [phone] [address], [phone] [address], [phone]  If unable to pay, or uninsured, contact [facility] [phone]) or [facility] Department [phone] in [place], [phone] in [facility]) to become qualified for the adult dental clinic   Other Low-Cost Community Dental Services: [facility]- [address] [place], [place], [place], [address], [phone], Ext. [phone], 2nd and 4th Thursday of the month at 6:30am.  10 clients each day by appointment, can sometimes see walk-in patients if someone does not show for an appointment. [facility]- [address] [place], [place], [address], [phone] [facility] [address] [place], [place], [address], [phone] [facility] Health Department- [phone] [facility] Health Department- [phone] [facility] [phone]

## 2018-02-12 NOTE — ED Provider Notes (Signed)
[facility] EMERGENCY DEPARTMENT Provider Note   CSN: [id number] Arrival date & time: 02/12/18  1014     History   Chief Complaint Chief Complaint  Patient presents with  . Cough  . URI    HPI Tiffany Allen is a 32 y.o. female presenting for 3 days of runny nose sore throat congestion cough and dysuria.  Patient states that symptoms began gradually and have worsened over the past 3 days.  She endorses coughing up white sputum.  States that she has felt warm but has not measured a fever.  Patient also endorsing generalized body aches for the past 2 days. On my examination patient denies hemoptysis.  Patient denies nausea/vomiting, diarrhea, abdominal pain, chest pain or shortness of breath.  HPI  Past Medical History:  Diagnosis Date  . Narcolepsy   . OSA (obstructive sleep apnea)   . RLS (restless legs syndrome)     Patient Active Problem List   Diagnosis Date Noted  . Morbid obesity due to excess calories (HCC) 01/27/2017  . OBSTRUCTIVE SLEEP APNEA 03/29/2008  . RESTLESS LEGS SYNDROME 03/29/2008  . 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 No pertinent surgical history.   OB History   None      Home Medications    Prior to Admission medications   Medication Sig Start Date End Date Taking? Authorizing Provider  benzonatate (TESSALON) 100 MG capsule Take 1 capsule (100 mg total) by mouth every 8 (eight) hours. 02/12/18   Harlene Salts A, PA-C  cephALEXin (KEFLEX) 500 MG capsule Take 1 capsule (500 mg total) by mouth 2 (two) times daily for 7 days. 02/12/18 02/19/18  Harlene Salts A, PA-C  fluticasone (FLONASE) 50 MCG/ACT nasal spray Place 2 sprays into both nostrils daily. 08/06/17   Caccavale, Sophia, PA-C  modafinil (PROVIGIL) 100 MG tablet 1 or 2 daily as needed 04/30/17   Jetty Duhamel D, MD  phenol (CHLORASEPTIC) 1.4 % LIQD Use as directed 1 spray in the mouth or throat as needed for throat irritation / pain.  08/06/17   Caccavale, Sophia, PA-C    Family History Family History  Problem Relation Age of Onset  . Atrial fibrillation Mother   . Diabetes Paternal Grandmother     Social History Social History   Tobacco Use  . Smoking status: Current Every Day Smoker    Packs/day: 0.50    Years: 8.00    Pack years: 4.00    Types: Cigarettes  . Smokeless tobacco: Never Used  Substance Use Topics  . Alcohol use: No  . Drug use: No     Allergies   Adhesive [tape]; Diphenhydramine hcl; and Ibuprofen   Review of Systems Review of Systems  Constitutional: Positive for fever. Negative for chills and fatigue.  HENT: Positive for congestion, rhinorrhea and sore throat. Negative for trouble swallowing and voice change.   Respiratory: Positive for cough. Negative for shortness of breath.   Cardiovascular: Negative.  Negative for chest pain and leg swelling.  Gastrointestinal: Negative.  Negative for abdominal pain, diarrhea, nausea and vomiting.  Genitourinary: Positive for difficulty urinating and dysuria. Negative for flank pain, hematuria, pelvic pain, vaginal bleeding and vaginal discharge.  Musculoskeletal: Positive for arthralgias and myalgias.  Neurological: Negative.  Negative for dizziness, weakness, numbness and headaches.     Physical Exam Updated Vital Signs BP 127/69 (BP Location: Right Wrist)   Pulse 85   Temp 98.5 F (36.9 C) (Oral)   Resp 18   SpO2  97%   Physical Exam  Constitutional: She is oriented to person, place, and time. She appears well-developed and well-nourished. No distress.  HENT:  Head: Normocephalic and atraumatic.  Right Ear: Hearing, tympanic membrane, external ear and ear canal normal.  Left Ear: Hearing, tympanic membrane, external ear and ear canal normal.  Nose: Rhinorrhea present.  Mouth/Throat: Uvula is midline, oropharynx is clear and moist and mucous membranes are normal. No trismus in the jaw. No uvula swelling.  No signs of peritonsillar  abscess, retropharyngeal abscess or Ludwig's angina.  Eyes: Pupils are equal, round, and reactive to light. EOM are normal.  Neck: Trachea normal, normal range of motion, full passive range of motion without pain and phonation normal. Neck supple. No tracheal tenderness present. No neck rigidity. No tracheal deviation, no edema and no erythema present.  Cardiovascular: Normal rate, regular rhythm, normal heart sounds and intact distal pulses.  Pulses:      Dorsalis pedis pulses are 2+ on the right side, and 2+ on the left side.       Posterior tibial pulses are 2+ on the right side, and 2+ on the left side.  Pulmonary/Chest: Effort normal and breath sounds normal. No respiratory distress. She has no wheezes.  Abdominal: Soft. Bowel sounds are normal. There is no tenderness. There is no rigidity, no rebound, no guarding and no CVA tenderness.  Genitourinary:  Genitourinary Comments: Deferred by patient.  Musculoskeletal: Normal range of motion.  Feet:  Right Foot:  Protective Sensation: 3 sites tested. 3 sites sensed.  Left Foot:  Protective Sensation: 3 sites tested. 3 sites sensed.  Neurological: She is alert and oriented to person, place, and time.  Skin: Skin is warm and dry. Capillary refill takes less than 2 seconds.  Psychiatric: She has a normal mood and affect. Her behavior is normal.     ED Treatments / Results  Labs (all labs ordered are listed, but only abnormal results are displayed) Labs Reviewed  URINALYSIS, ROUTINE W REFLEX MICROSCOPIC - Abnormal; Notable for the following components:      Result Value   APPearance HAZY (*)    Hgb urine dipstick SMALL (*)    Bacteria, UA RARE (*)    All other components within normal limits  GROUP A STREP BY PCR  URINE CULTURE  POC URINE PREG, ED    EKG None  Radiology Dg Chest 2 View  Result Date: 02/12/2018 CLINICAL DATA:  Productive cough and chest tightness EXAM: CHEST - 2 VIEW COMPARISON:  09/23/2015 FINDINGS: The heart  size and mediastinal contours are within normal limits. Both lungs are clear. The visualized skeletal structures are unremarkable. IMPRESSION: No active cardiopulmonary disease. Electronically Signed   By: Alcide CleverMark  Lukens M.D.   On: 02/12/2018 11:12    Procedures Procedures (including critical care time)  Medications Ordered in ED Medications - No data to display   Initial Impression / Assessment and Plan / ED Course  I have reviewed the triage vital signs and the nursing notes.  Pertinent labs & imaging results that were available during my care of the patient were reviewed by me and considered in my medical decision making (see chart for details).    Patient presenting of 3 days of upper respiratory symptoms and dysuria.  Urine Preg negative. Strep test negative. Urinalysis with small hemoglobin and rare bacteria. Chest x-ray negative.  Patient with symptoms consistent with URI for 3 days.  Patient's CXR is negative for acute infiltrate. Symptoms are likely of viral etiology.  Discussed that antibiotics are not indicated for viral infections. Patient will be discharged with symptomatic treatment. Patient verbalizes understanding and is agreeable with plan. Patient is hemodynamically stable and in no acute distress prior to discharge.  Patient also with 3 days of dysuria, UA with bacteria and hemoglobin.  Urine has been sent for culture.  Patient has been given Keflex for her dysuria and potential urinary tract infection.  At this time there does not appear to be any evidence of an acute emergency medical condition and the patient appears stable for discharge with appropriate outpatient follow up. Diagnosis was discussed with patient who verbalizes understanding of care plan and is agreeable to discharge. I have discussed return precautions with patient who verbalize understanding of return precautions. Patient strongly encouraged to follow-up with their PCP. All questions  answered.    Note: Portions of this report may have been transcribed using voice recognition software. Every effort was made to ensure accuracy; however, inadvertent computerized transcription errors may still be present.   Final Clinical Impressions(s) / ED Diagnoses   Final diagnoses:  Viral URI with cough  Dysuria    ED Discharge Orders         Ordered    cephALEXin (KEFLEX) 500 MG capsule  2 times daily     02/12/18 1357    benzonatate (TESSALON) 100 MG capsule  Every 8 hours     02/12/18 1357           Elizabeth Palau 02/12/18 1628    Tegeler, Canary Brim, MD 02/14/18 [phone]

## 2018-02-12 NOTE — ED Notes (Signed)
Patient called out.  When this RN went in patient states she just had a strong cough, an episode of gagging, and the mucous that came up had streaks of blood in it.  This RN reassured her and provided her an emesis back to collect a sample if it occurs again

## 2018-02-13 LAB — URINE CULTURE

## 2019-09-16 ENCOUNTER — Emergency Department (HOSPITAL_COMMUNITY): Payer: Self-pay

## 2019-09-16 ENCOUNTER — Emergency Department (HOSPITAL_COMMUNITY)
Admission: EM | Admit: 2019-09-16 | Discharge: 2019-09-16 | Disposition: A | Discharge status: Home / Self Care | Payer: Self-pay | Attending: Emergency Medicine | Admitting: Emergency Medicine

## 2019-09-16 ENCOUNTER — Encounter (HOSPITAL_COMMUNITY): Payer: Self-pay | Admitting: Emergency Medicine

## 2019-09-16 DIAGNOSIS — R0789 Other chest pain: Secondary | ICD-10-CM | POA: Insufficient documentation

## 2019-09-16 DIAGNOSIS — F17210 Nicotine dependence, cigarettes, uncomplicated: Secondary | ICD-10-CM | POA: Insufficient documentation

## 2019-09-16 HISTORY — DX: Essential (primary) hypertension: I10

## 2019-09-16 LAB — CBC
HCT: 48.2 % — ABNORMAL HIGH (ref 36.0–46.0)
Hemoglobin: 15 g/dL (ref 12.0–15.0)
MCH: 27.9 pg (ref 26.0–34.0)
MCHC: 31.1 g/dL (ref 30.0–36.0)
MCV: 89.8 fL (ref 80.0–100.0)
Platelets: 269 10*3/uL (ref 150–400)
RBC: 5.37 MIL/uL — ABNORMAL HIGH (ref 3.87–5.11)
RDW: 14.5 % (ref 11.5–15.5)
WBC: 9.4 10*3/uL (ref 4.0–10.5)
nRBC: 0 % (ref 0.0–0.2)

## 2019-09-16 LAB — BASIC METABOLIC PANEL
Anion gap: 10 (ref 5–15)
BUN: 10 mg/dL (ref 6–20)
CO2: 22 mmol/L (ref 22–32)
Calcium: 8.8 mg/dL — ABNORMAL LOW (ref 8.9–10.3)
Chloride: 108 mmol/L (ref 98–111)
Creatinine, Ser: 0.94 mg/dL (ref 0.44–1.00)
GFR calc Af Amer: >60 mL/min (ref >60)
GFR calc non Af Amer: >60 mL/min (ref >60)
Glucose, Bld: 94 mg/dL (ref 70–99)
Potassium: 4.2 mmol/L (ref 3.5–5.1)
Sodium: 140 mmol/L (ref 135–145)

## 2019-09-16 LAB — URINALYSIS, ROUTINE W REFLEX MICROSCOPIC
Bilirubin Urine: NEGATIVE
Glucose, UA: NEGATIVE mg/dL
Hgb urine dipstick: NEGATIVE
Ketones, ur: NEGATIVE mg/dL
Leukocytes,Ua: NEGATIVE
Nitrite: NEGATIVE
Protein, ur: NEGATIVE mg/dL
Specific Gravity, Urine: 1.02 (ref 1.005–1.030)
pH: 5 (ref 5.0–8.0)

## 2019-09-16 LAB — TROPONIN I (HIGH SENSITIVITY)
Troponin I (High Sensitivity): 4 ng/L (ref <18)
Troponin I (High Sensitivity): 5 ng/L (ref <18)

## 2019-09-16 LAB — I-STAT BETA HCG BLOOD, ED (MC, WL, AP ONLY): I-stat hCG, quantitative: <5 m[IU]/mL (ref <5)

## 2019-09-16 LAB — POC URINE PREG, ED: Preg Test, Ur: NEGATIVE

## 2019-09-16 MED ORDER — SODIUM CHLORIDE 0.9% FLUSH: 3.0000 mL | Freq: Once | INTRAVENOUS | Status: DC

## 2019-09-16 NOTE — Discharge Instructions (Addendum)
Please return for any problem.  Follow-up with your regular care provider as instructed. °

## 2019-09-16 NOTE — ED Notes (Signed)
Patient verbalizes understanding of discharge instructions. Opportunity for questioning and answers were provided. Armband removed by staff, pt discharged from ED to home 

## 2019-09-16 NOTE — ED Triage Notes (Addendum)
States  Had high bp 140 / 70 yesterday  And felt it was high and then she passed out and started shaking , her mom came over and  But she did not come to ER yesterday , she was having pain in both arms  Sharp she states , may be preg

## 2019-09-16 NOTE — ED Provider Notes (Signed)
[place] EMERGENCY DEPARTMENT Provider Note   CSN: [id number] Arrival date & time: 09/16/19  1122     History Chief Complaint  Patient presents with  . Chest Pain    Tiffany Allen is a 34 y.o. female.  34 year old female with prior medical history detailed below presents for evaluation of left-sided chest discomfort. Patient reports 3 to 4 days of sharp intermittent pain to the left chest wall. Patient's symptoms are worse with movement. She denies associated shortness of breath or fever. She denies cough. She denies any specific inciting event. She denies associated diaphoresis or nausea.  She reports that she is comfortable at time of exam.  The history is provided by the patient and medical records.  Chest Pain Pain location:  L chest Pain quality: sharp and shooting   Pain radiates to:  Does not radiate Pain severity:  No pain Onset quality:  Sudden Duration:  4 days Timing:  Intermittent Progression:  Waxing and waning Chronicity:  New Context: movement   Relieved by:  Nothing Worsened by:  Nothing      Past Medical History:  Diagnosis Date  . Hypertension    does not take meds  . Narcolepsy   . OSA (obstructive sleep apnea)   . RLS (restless legs syndrome)     Patient Active Problem List   Diagnosis Date Noted  . Morbid obesity due to excess calories ([place]) 01/27/2017  . OBSTRUCTIVE SLEEP APNEA 03/29/2008  . RESTLESS LEGS SYNDROME 03/29/2008  . 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   OB History   No obstetric history on file.     Family History  Problem Relation Age of Onset  . Atrial fibrillation Mother   . Diabetes Paternal Grandmother     Social History   Tobacco Use  . Smoking status: Current Every Day Smoker    Packs/day: 0.50    Years: 8.00    Pack years: 4.00    Types: Cigarettes  . Smokeless tobacco: Never Used  Substance Use Topics  . Alcohol use: No  . Drug use:  No    Home Medications Prior to Admission medications   Medication Sig Start Date End Date Taking? Authorizing Provider  aspirin 325 MG EC tablet Take 325 mg by mouth daily as needed for pain (chest pain).   Yes [provider]  benzonatate (TESSALON) 100 MG capsule Take 1 capsule (100 mg total) by mouth every 8 (eight) hours. Patient not taking: Reported on 09/16/2019 02/12/18   Nuala Alpha A, PA-C  fluticasone [facility]) 50 MCG/ACT nasal spray Place 2 sprays into both nostrils daily. Patient not taking: Reported on 09/16/2019 08/06/17   Caccavale, Sophia, PA-C  modafinil (PROVIGIL) 100 MG tablet 1 or 2 daily as needed Patient not taking: Reported on 09/16/2019 04/30/17   Baird Lyons D, MD  phenol (CHLORASEPTIC) 1.4 % LIQD Use as directed 1 spray in the mouth or throat as needed for throat irritation / pain. Patient not taking: Reported on 09/16/2019 08/06/17   Caccavale, Sophia, PA-C    Allergies    Adhesive [tape], Diphenhydramine hcl, and Ibuprofen  Review of Systems   Review of Systems  Cardiovascular: Positive for chest pain.  All other systems reviewed and are negative.   Physical Exam Updated Vital Signs BP 116/62   Pulse 66   Temp 98.6 F (37 C) (Oral)   Resp 17   Ht 5\' 2"  (1.575 m)   Wt Marland Kitchen)  154.2 kg   SpO2 100%   BMI 62.19 kg/m   Physical Exam Vitals and nursing note reviewed.  Constitutional:      General: She is not in acute distress.    Appearance: She is well-developed.  HENT:     Head: Normocephalic and atraumatic.  Eyes:     Conjunctiva/sclera: Conjunctivae normal.     Pupils: Pupils are equal, round, and reactive to light.  Cardiovascular:     Rate and Rhythm: Normal rate and regular rhythm.     Heart sounds: Normal heart sounds.  Pulmonary:     Effort: Pulmonary effort is normal. No respiratory distress.     Breath sounds: Normal breath sounds.  Abdominal:     General: There is no distension.     Palpations: Abdomen is soft.      Tenderness: There is no abdominal tenderness.  Musculoskeletal:        General: No deformity. Normal range of motion.     Cervical back: Normal range of motion and neck supple.  Skin:    General: Skin is warm and dry.  Neurological:     Mental Status: She is alert and oriented to person, place, and time.     ED Results / Procedures / Treatments   Labs (all labs ordered are listed, but only abnormal results are displayed) Labs Reviewed  BASIC METABOLIC PANEL - Abnormal; Notable for the following components:      Result Value   Calcium 8.8 (*)    All other components within normal limits  CBC - Abnormal; Notable for the following components:   RBC 5.37 (*)    HCT 48.2 (*)    All other components within normal limits  URINALYSIS, ROUTINE W REFLEX MICROSCOPIC  I-STAT BETA HCG BLOOD, ED ([facility], [facility], [facility] ONLY)  POC URINE PREG, ED  TROPONIN I (HIGH SENSITIVITY)  TROPONIN I (HIGH SENSITIVITY)    EKG EKG Interpretation  Date/Time:  Friday September 16 2019 11:25:17 EDT Ventricular Rate:  95 PR Interval:  124 QRS Duration: 88 QT Interval:  358 QTC Calculation: 449 R Axis:   39 Text Interpretation: Normal sinus rhythm Normal ECG Confirmed by Kristine Royal [phone]) on 09/16/2019 12:06:47 PM Also confirmed by Kristine Royal [phone]), editor Elita Quick ([id number])  on 09/16/2019 2:55:35 PM   Radiology DG Chest 2 View  Result Date: 09/16/2019 CLINICAL DATA:  Chest pain EXAM: CHEST - 2 VIEW COMPARISON:  02/12/2018 FINDINGS: The heart size and mediastinal contours are within normal limits. No focal airspace consolidation, pleural effusion, or pneumothorax. The visualized skeletal structures are unremarkable. IMPRESSION: No active cardiopulmonary disease. Electronically Signed   By: Duanne Guess D.O.   On: 09/16/2019 12:09    Procedures Procedures (including critical care time)  Medications Ordered in ED Medications  sodium chloride flush (NS) 0.9 % injection 3 mL (has no administration  in time range)    ED Course  I have reviewed the triage vital signs and the nursing notes.  Pertinent labs & imaging results that were available during my care of the patient were reviewed by me and considered in my medical decision making (see chart for details).    MDM Rules/Calculators/A&P                      MDM  Screen complete  Tiffany Allen was evaluated in Emergency Department on 09/16/2019 for the symptoms described in the history of present illness. She was evaluated in the context of the  global COVID-19 pandemic, which necessitated consideration that the patient might be at risk for infection with the SARS-CoV-2 virus that causes COVID-19. Institutional protocols and algorithms that pertain to the evaluation of patients at risk for COVID-19 are in a state of rapid change based on information released by regulatory bodies including the CDC and federal and state organizations. These policies and algorithms were followed during the patient's care in the ED.   Patient is presenting for evaluation of reported chest discomfort. Patient's describe symptoms are atypical in nature. Presentation is not consistent with ACS.  Troponin x 2 is negative.  EKG did not demonstrate ischemia.  Patient feels improved following her ED evaluation. She now desires discharge home. Importance of close follow-up is stressed. Final Clinical Impression(s) / ED Diagnoses Final diagnoses:  Atypical chest pain    Rx / DC Orders ED Discharge Orders    None       Wynetta Fines, MD 09/16/19 1550

## 2019-11-26 ENCOUNTER — Emergency Department (HOSPITAL_COMMUNITY)
Admission: EM | Admit: 2019-11-26 | Discharge: 2019-11-26 | Disposition: A | Discharge status: Home / Self Care | Payer: Self-pay | Attending: Emergency Medicine | Admitting: Emergency Medicine

## 2019-11-26 ENCOUNTER — Encounter (HOSPITAL_COMMUNITY): Payer: Self-pay

## 2019-11-26 ENCOUNTER — Emergency Department (HOSPITAL_COMMUNITY): Payer: Self-pay

## 2019-11-26 ENCOUNTER — Other Ambulatory Visit: Payer: Self-pay

## 2019-11-26 DIAGNOSIS — R0600 Dyspnea, unspecified: Secondary | ICD-10-CM | POA: Insufficient documentation

## 2019-11-26 DIAGNOSIS — F17210 Nicotine dependence, cigarettes, uncomplicated: Secondary | ICD-10-CM | POA: Insufficient documentation

## 2019-11-26 DIAGNOSIS — Z91040 Latex allergy status: Secondary | ICD-10-CM | POA: Insufficient documentation

## 2019-11-26 DIAGNOSIS — R079 Chest pain, unspecified: Secondary | ICD-10-CM | POA: Insufficient documentation

## 2019-11-26 LAB — CBC
HCT: 44.6 % (ref 36.0–46.0)
Hemoglobin: 14.4 g/dL (ref 12.0–15.0)
MCH: 28.5 pg (ref 26.0–34.0)
MCHC: 32.3 g/dL (ref 30.0–36.0)
MCV: 88.3 fL (ref 80.0–100.0)
Platelets: 300 10*3/uL (ref 150–400)
RBC: 5.05 MIL/uL (ref 3.87–5.11)
RDW: 14.2 % (ref 11.5–15.5)
WBC: 12.2 10*3/uL — ABNORMAL HIGH (ref 4.0–10.5)
nRBC: 0 % (ref 0.0–0.2)

## 2019-11-26 LAB — BASIC METABOLIC PANEL
Anion gap: 10 (ref 5–15)
BUN: 12 mg/dL (ref 6–20)
CO2: 23 mmol/L (ref 22–32)
Calcium: 9.1 mg/dL (ref 8.9–10.3)
Chloride: 106 mmol/L (ref 98–111)
Creatinine, Ser: 0.88 mg/dL (ref 0.44–1.00)
GFR calc Af Amer: >60 mL/min (ref >60)
GFR calc non Af Amer: >60 mL/min (ref >60)
Glucose, Bld: 78 mg/dL (ref 70–99)
Potassium: 4.4 mmol/L (ref 3.5–5.1)
Sodium: 139 mmol/L (ref 135–145)

## 2019-11-26 LAB — I-STAT BETA HCG BLOOD, ED (MC, WL, AP ONLY): I-stat hCG, quantitative: <5 m[IU]/mL (ref <5)

## 2019-11-26 LAB — TROPONIN I (HIGH SENSITIVITY): Troponin I (High Sensitivity): 3 ng/L (ref <18)

## 2019-11-26 MED ORDER — SODIUM CHLORIDE 0.9% FLUSH: 3.0000 mL | Freq: Once | INTRAVENOUS | Status: DC

## 2019-11-26 MED ORDER — TRAMADOL HCL 50 MG PO TABS
50.0000 mg | ORAL_TABLET | Freq: Once | ORAL | Status: AC
  Administered 2019-11-26: 50 mg via ORAL
  Filled 2019-11-26: qty 1

## 2019-11-26 NOTE — ED Notes (Signed)
Attempted to obtain lab work.  Stuck x1 and vein rolled.  Pt demanded I remove needle because she "doesn't want a bruise or a bubble and it hurts too bad."

## 2019-11-26 NOTE — ED Triage Notes (Signed)
Pt returned, stating in lobby but did not hear her name called

## 2019-11-26 NOTE — ED Triage Notes (Signed)
Arrived POV from home. Patient c/o chest pain that started earlier today and [facility] with exertion. NAD

## 2019-11-26 NOTE — ED Notes (Signed)
Pt called for recheck vitals and room. No response from lobby

## 2019-11-28 NOTE — ED Provider Notes (Signed)
[facility]-EMERGENCY DEPT Provider Note   CSN: [id number] Arrival date & time: 11/26/19  1448     History Chief Complaint  Patient presents with  . Chest Pain    Tiffany Allen is a 34 y.o. female.  HPI   34 year old female with chest pain.  Describes tightness in the center of her chest and coming up into bilateral shoulders.  Onset this morning while at rest.  His symptoms have been persistent since then.  Associated with mild dyspnea.  No cough.  No nausea or diaphoresis.  She has not noticed any appreciable exacerbating relieving factors.  No noticeable positional component.  Past Medical History:  Diagnosis Date  . Hypertension    does not take meds  . Narcolepsy   . OSA (obstructive sleep apnea)   . RLS (restless legs syndrome)    Patient Active Problem List   Diagnosis Date Noted  . Morbid obesity due to excess calories (HCC) 01/27/2017  . OBSTRUCTIVE SLEEP APNEA 03/29/2008  . RESTLESS LEGS SYNDROME 03/29/2008  . 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 No pertinent surgical history.   OB History   No obstetric history on file.    Family History  Problem Relation Age of Onset  . Atrial fibrillation Mother   . Diabetes Paternal Grandmother    Social History   Tobacco Use  . Smoking status: Current Every Day Smoker    Packs/day: 0.50    Years: 8.00    Pack years: 4.00    Types: Cigarettes  . Smokeless tobacco: Never Used  Vaping Use  . Vaping Use: Never used  Substance Use Topics  . Alcohol use: No  . Drug use: No    Home Medications Prior to Admission medications   Medication Sig Start Date End Date Taking? Authorizing Provider  ASPIRIN PO Take 1 tablet by mouth daily as needed (for chest pain).   Yes [provider]  benzonatate (TESSALON) 100 MG capsule Take 1 capsule (100 mg total) by mouth every 8 (eight) hours. Patient not taking: Reported on 09/16/2019 02/12/18   Harlene Salts A, PA-C   fluticasone [facility]) 50 MCG/ACT nasal spray Place 2 sprays into both nostrils daily. Patient not taking: Reported on 09/16/2019 08/06/17   Caccavale, Sophia, PA-C  modafinil (PROVIGIL) 100 MG tablet 1 or 2 daily as needed Patient not taking: Reported on 09/16/2019 04/30/17   Jetty Duhamel D, MD  phenol (CHLORASEPTIC) 1.4 % LIQD Use as directed 1 spray in the mouth or throat as needed for throat irritation / pain. Patient not taking: Reported on 09/16/2019 08/06/17   Caccavale, Sophia, PA-C    Allergies    Adhesive [tape], Diphenhydramine hcl, Ibuprofen, and Latex  Review of Systems   Review of Systems All systems reviewed and negative, other than as noted in HPI.  Physical Exam Updated Vital Signs BP (!) 144/99   Pulse 67   Temp 98.4 F (36.9 C) (Oral)   Resp 16   LMP 11/04/2019   SpO2 100%   Physical Exam Vitals and nursing note reviewed.  Constitutional:      General: She is not in acute distress.    Appearance: She is well-developed. She is obese.  HENT:     Head: Normocephalic and atraumatic.  Eyes:     General:        Right eye: No discharge.        Left eye: No discharge.     Conjunctiva/sclera: Conjunctivae normal.  Cardiovascular:  Rate and Rhythm: Normal rate and regular rhythm.     Heart sounds: Normal heart sounds. No murmur heard.  No friction rub. No gallop.   Pulmonary:     Effort: Pulmonary effort is normal. No respiratory distress.     Breath sounds: Normal breath sounds.  Abdominal:     General: There is no distension.     Palpations: Abdomen is soft.     Tenderness: There is no abdominal tenderness.  Musculoskeletal:        General: No tenderness.     Cervical back: Neck supple.     Comments: Lower extremities symmetric as compared to each other. No calf tenderness. Negative Homan's. No palpable cords.   Skin:    General: Skin is warm and dry.  Neurological:     Mental Status: She is alert.  Psychiatric:        Behavior: Behavior normal.         Thought Content: Thought content normal.     ED Results / Procedures / Treatments   Labs (all labs ordered are listed, but only abnormal results are displayed) Labs Reviewed  CBC - Abnormal; Notable for the following components:      Result Value   WBC 12.2 (*)    All other components within normal limits  BASIC METABOLIC PANEL  I-STAT BETA HCG BLOOD, ED ([facility], [facility], [facility] ONLY)  TROPONIN I (HIGH SENSITIVITY)  TROPONIN I (HIGH SENSITIVITY)    EKG EKG Interpretation  Date/Time:  Saturday November 26 2019 15:18:49 EDT Ventricular Rate:  83 PR Interval:    QRS Duration: 89 QT Interval:  358 QTC Calculation: 421 R Axis:   30 Text Interpretation: Sinus rhythm Borderline T wave abnormalities 12 Lead; [place] Confirmed by Virgel Manifold [phone]) on 11/26/2019 10:49:46 PM   Radiology DG Chest 2 View  Result Date: 11/26/2019 CLINICAL DATA:  Chest pain and shortness of breath. EXAM: CHEST - 2 VIEW COMPARISON:  Chest x-ray dated September 16, 2019. FINDINGS: The heart size and mediastinal contours are within normal limits. Both lungs are clear. The visualized skeletal structures are unremarkable. IMPRESSION: No active cardiopulmonary disease. Electronically Signed   By: Titus Dubin M.D.   On: 11/26/2019 15:58    Procedures Procedures (including critical care time)  Medications Ordered in ED Medications  traMADol (ULTRAM) tablet 50 mg (50 mg Oral Given 11/26/19 2316)    ED Course  I have reviewed the triage vital signs and the nursing notes.  Pertinent labs & imaging results that were available during my care of the patient were reviewed by me and considered in my medical decision making (see chart for details).    MDM Rules/Calculators/A&P                          34 year old female with chest pain.  Seems atypical for ACS.  Doubt PE, dissection or other emergent process.  EKG with no overt ischemic changes.  Chest x-ray clear.  She is not tachycardic nor tachypneic.  Lungs sound  clear on exam.  O2 saturations are normal on room air.  No signs/symptoms of DVT.  Final Clinical Impression(s) / ED Diagnoses Final diagnoses:  Chest pain, unspecified type    Rx / DC Orders ED Discharge Orders    None       Virgel Manifold, MD 11/28/19 1012

## 2020-03-09 ENCOUNTER — Ambulatory Visit (HOSPITAL_COMMUNITY)
Admission: EM | Admit: 2020-03-09 | Discharge: 2020-03-09 | Disposition: A | Discharge status: Home / Self Care | Payer: Self-pay | Attending: Urgent Care | Admitting: Urgent Care

## 2020-03-09 ENCOUNTER — Other Ambulatory Visit: Payer: Self-pay

## 2020-03-09 ENCOUNTER — Encounter (HOSPITAL_COMMUNITY): Payer: Self-pay

## 2020-03-09 DIAGNOSIS — Z872 Personal history of diseases of the skin and subcutaneous tissue: Secondary | ICD-10-CM

## 2020-03-09 DIAGNOSIS — N644 Mastodynia: Secondary | ICD-10-CM

## 2020-03-09 DIAGNOSIS — N611 Abscess of the breast and nipple: Secondary | ICD-10-CM

## 2020-03-09 MED ORDER — LIDOCAINE-EPINEPHRINE 1 %-1:100000 IJ SOLN
INTRAMUSCULAR | Status: AC
  Filled 2020-03-09: qty 1

## 2020-03-09 MED ORDER — TRAMADOL HCL 50 MG PO TABS: 50.0000 mg | ORAL_TABLET | Freq: Four times a day (QID) | ORAL | 0 refills | Status: DC | PRN

## 2020-03-09 MED ORDER — DOXYCYCLINE HYCLATE 100 MG PO CAPS: 100.0000 mg | ORAL_CAPSULE | Freq: Two times a day (BID) | ORAL | 0 refills | Status: DC

## 2020-03-09 NOTE — ED Triage Notes (Signed)
Pt present a abscess on her right breast, the area is red and has started draining. Pt states it is sensitive to the touch.

## 2020-03-09 NOTE — Discharge Instructions (Addendum)
Please change your dressing 3-5 times daily. Do not apply any ointments or creams. Each time you change your dressing, make sure that you are pressing on the wound to get pus to come out.  Try your best to have a family member help you clean gently around the perimeter of the wound with gentle soap and warm water. Pat your wound dry and let it air out if possible to make sure it is dry before reapplying another dressing.   Please schedule Tylenol at 500 mg - 650 mg once every 6 hours as needed for aches and pains.  If you still have pain despite taking Tylenol regularly, this is breakthrough pain.  You can use tramadol once every 6 hours for this.  Once your pain is better controlled, switch back to just Tylenol.

## 2020-03-09 NOTE — ED Provider Notes (Signed)
[facility]   MRN: [id number] DOB: [birth year]  Subjective:   Tiffany Allen is a 34 y.o. female presenting for 3 day hx of acute onset severe right sided breast pain. Has a hx of hidradenitis. Does not see a dermatologist. Has allergies to ibuprofen.   No current facility-administered medications for this encounter.  Current Outpatient Medications:    ASPIRIN PO, Take 1 tablet by mouth daily as needed (for chest pain)., Disp: , Rfl:    benzonatate (TESSALON) 100 MG capsule, Take 1 capsule (100 mg total) by mouth every 8 (eight) hours. (Patient not taking: Reported on 09/16/2019), Disp: 15 capsule, Rfl: 0   fluticasone (FLONASE) 50 MCG/ACT nasal spray, Place 2 sprays into both nostrils daily. (Patient not taking: Reported on 09/16/2019), Disp: 16 g, Rfl: 0   modafinil (PROVIGIL) 100 MG tablet, 1 or 2 daily as needed (Patient not taking: Reported on 09/16/2019), Disp: 60 tablet, Rfl: 2   phenol (CHLORASEPTIC) 1.4 % LIQD, Use as directed 1 spray in the mouth or throat as needed for throat irritation / pain. (Patient not taking: Reported on 09/16/2019), Disp: 177 mL, Rfl: 0   Allergies  Allergen Reactions   Adhesive [Tape]    Diphenhydramine Hcl     throat swelling   Ibuprofen Swelling   Latex Rash    Past Medical History:  Diagnosis Date   Hypertension    does not take meds   Narcolepsy    OSA (obstructive sleep apnea)    RLS (restless legs syndrome)      History reviewed. No pertinent surgical history.  Family History  Problem Relation Age of Onset   Atrial fibrillation Mother    Diabetes Paternal Grandmother     Social History   Tobacco Use   Smoking status: Current Every Day Smoker    Packs/day: 0.50    Years: 8.00    Pack years: 4.00    Types: Cigarettes   Smokeless tobacco: Never Used  Building services engineer Use: Never used  Substance Use Topics   Alcohol use: No   Drug use: No    ROS   Objective:   Vitals: BP (!)  148/110 (BP Location: Right Arm)    Pulse (!) 115    Temp 99.5 F (37.5 C) (Oral)    Resp 18    SpO2 100%   Physical Exam Constitutional:      General: She is not in acute distress.    Appearance: Normal appearance. She is well-developed. She is obese. She is not ill-appearing, toxic-appearing or diaphoretic.  HENT:     Head: Normocephalic and atraumatic.     Nose: Nose normal.     Mouth/Throat:     Mouth: Mucous membranes are moist.     Pharynx: Oropharynx is clear.  Eyes:     General: No scleral icterus.       Right eye: No discharge.        Left eye: No discharge.     Extraocular Movements: Extraocular movements intact.     Conjunctiva/sclera: Conjunctivae normal.     Pupils: Pupils are equal, round, and reactive to light.  Cardiovascular:     Rate and Rhythm: Normal rate.  Pulmonary:     Effort: Pulmonary effort is normal.  Chest:    Skin:    General: Skin is warm and dry.  Neurological:     General: No focal deficit present.     Mental Status: She is alert and oriented  to person, place, and time.  Psychiatric:        Mood and Affect: Mood normal.        Behavior: Behavior normal.        Thought Content: Thought content normal.        Judgment: Judgment normal.    PROCEDURE NOTE: I&D of Abscess Verbal consent obtained. Local anesthesia with 3cc of 1% lidocaine with epinpephrine. Site cleansed with alcohol swab. Incision of 1cm was made using a 11 blade, discharge of ~20cc of pus and serosanguinous fluid. Wound cavity was explored and loculations loosened with curved hemostats and no packing placed. Cleansed and dressed.   Assessment and Plan :   PDMP not reviewed this encounter.  1. Abscess of right breast   2. Breast pain   3. History of hidradenitis suppurativa     Start doxycycline, schedule APAP and use tramadol for breakthrough pain. Follow up in 2 days. Counseled patient on potential for adverse effects with medications prescribed/recommended today, ER  and return-to-clinic precautions discussed, patient verbalized understanding.    Wallis Bamberg, [place] 03/09/20 1413

## 2020-03-12 ENCOUNTER — Ambulatory Visit (HOSPITAL_COMMUNITY): Admit: 2020-03-12 | Discharge status: Against Medical Advice | Payer: Self-pay | Source: Home / Self Care

## 2020-03-13 ENCOUNTER — Ambulatory Visit (HOSPITAL_COMMUNITY)
Admission: RE | Admit: 2020-03-13 | Discharge: 2020-03-13 | Disposition: A | Discharge status: Home / Self Care | Payer: Self-pay | Source: Ambulatory Visit

## 2020-03-13 ENCOUNTER — Encounter (HOSPITAL_COMMUNITY): Payer: Self-pay

## 2020-03-13 ENCOUNTER — Other Ambulatory Visit: Payer: Self-pay

## 2020-03-13 VITALS — BP 142/94 | HR 88 | Temp 98.0°F | Resp 20

## 2020-03-13 DIAGNOSIS — Z5189 Encounter for other specified aftercare: Secondary | ICD-10-CM

## 2020-03-13 NOTE — ED Triage Notes (Signed)
Seen 9/24 for abscess to right breast.  Patient reports this area is doing better.  Decreased in size and in pain

## 2020-03-13 NOTE — ED Provider Notes (Signed)
[facility]   MRN: [id number] DOB: 07-27-1985  Subjective:   Tiffany Allen is a 34 y.o. female presenting for wound recheck. Had an I&D performed 4 days ago. Has done very well with wound care, doxycycline.   No current facility-administered medications for this encounter.  Current Outpatient Medications:  .  ASPIRIN PO, Take 1 tablet by mouth daily as needed (for chest pain)., Disp: , Rfl:  .  doxycycline (VIBRAMYCIN) 100 MG capsule, Take 1 capsule (100 mg total) by mouth 2 (two) times daily., Disp: 20 capsule, Rfl: 0 .  traMADol (ULTRAM) 50 MG tablet, Take 1 tablet (50 mg total) by mouth every 6 (six) hours as needed., Disp: 15 tablet, Rfl: 0 .  benzonatate (TESSALON) 100 MG capsule, Take 1 capsule (100 mg total) by mouth every 8 (eight) hours. (Patient not taking: Reported on 09/16/2019), Disp: 15 capsule, Rfl: 0 .  fluticasone (FLONASE) 50 MCG/ACT nasal spray, Place 2 sprays into both nostrils daily. (Patient not taking: Reported on 09/16/2019), Disp: 16 g, Rfl: 0 .  modafinil (PROVIGIL) 100 MG tablet, 1 or 2 daily as needed (Patient not taking: Reported on 09/16/2019), Disp: 60 tablet, Rfl: 2 .  phenol (CHLORASEPTIC) 1.4 % LIQD, Use as directed 1 spray in the mouth or throat as needed for throat irritation / pain. (Patient not taking: Reported on 09/16/2019), Disp: 177 mL, Rfl: 0   Allergies  Allergen Reactions  . Adhesive [Tape]   . Diphenhydramine Hcl     throat swelling  . Ibuprofen Swelling  . Latex Rash    Past Medical History:  Diagnosis Date  . Hypertension    does not take meds  . Narcolepsy   . OSA (obstructive sleep apnea)   . RLS (restless legs syndrome)      History reviewed. No pertinent surgical history.  Family History  Problem Relation Age of Onset  . Atrial fibrillation Mother   . Diabetes Paternal Grandmother     Social History   Tobacco Use  . Smoking status: Current Every Day Smoker    Packs/day: 0.50    Years: 8.00    Pack  years: 4.00    Types: Cigarettes  . Smokeless tobacco: Never Used  Vaping Use  . Vaping Use: Never used  Substance Use Topics  . Alcohol use: No  . Drug use: No    ROS   Objective:   Vitals: BP (!) 142/94 (BP Location: Left Arm) Comment (BP Location): regular cuff on forearm  Pulse 88   Temp 98 F (36.7 C) (Oral)   Resp 20   SpO2 97%   Physical Exam Exam conducted with a chaperone present.  Constitutional:      General: She is not in acute distress.    Appearance: Normal appearance. She is well-developed. She is not ill-appearing, toxic-appearing or diaphoretic.  HENT:     Head: Normocephalic and atraumatic.     Nose: Nose normal.     Mouth/Throat:     Mouth: Mucous membranes are moist.     Pharynx: Oropharynx is clear.  Eyes:     General: No scleral icterus.       Right eye: No discharge.        Left eye: No discharge.     Extraocular Movements: Extraocular movements intact.     Conjunctiva/sclera: Conjunctivae normal.     Pupils: Pupils are equal, round, and reactive to light.  Cardiovascular:     Rate and Rhythm: Normal rate.  Pulmonary:     Effort: Pulmonary effort is normal.  Chest:       Comments: Scarring of hidradenitis noted.  Skin:    General: Skin is warm and dry.  Neurological:     General: No focal deficit present.     Mental Status: She is alert and oriented to person, place, and time.  Psychiatric:        Mood and Affect: Mood normal.        Behavior: Behavior normal.        Thought Content: Thought content normal.        Judgment: Judgment normal.      Assessment and Plan :   PDMP not reviewed this encounter.  1. Wound check, abscess     Wound healing well. Continue wound care, finish doxycycline. Counseled patient on potential for adverse effects with medications prescribed/recommended today, ER and return-to-clinic precautions discussed, patient verbalized understanding.    Wallis Bamberg, [place] 03/13/20 1538

## 2020-03-13 NOTE — Discharge Instructions (Signed)
Continue with your wound care. Please change your dressing 3-5 times daily. Do not apply any ointments or creams. Each time you change your dressing, make sure that you are pressing on the wound to get pus to come out. Try your best to have a family member help you clean gently around the perimeter of the wound with gentle soap and warm water. Pat your wound dry and let it air out if possible to make sure it is dry before reapplying another dressing. Please schedule Tylenol at 500 mg - 650 mg once every 6 hours as needed for aches and pains. If you still have pain despite taking Tylenol regularly, this is breakthrough pain. You can use tramadol once every 6 hours for this. Once your pain is better controlled, switch back to just Tylenol.

## 2020-03-13 NOTE — ED Notes (Signed)
At bedside for physician exam of patient's wound

## 2020-03-22 ENCOUNTER — Ambulatory Visit: Payer: Self-pay | Admitting: Internal Medicine

## 2020-03-25 NOTE — Progress Notes (Deleted)
Cardiology Office Note:    Date:  03/25/2020   ID:  Tiffany Allen, DOB May 11, 1986, MRN [id number]  PCP:  Default, Provider, MD  [facility] Cardiologist:  No primary care provider on file.  [facility] Electrophysiologist:  None   Referring MD: Raeford Razor, MD     History of Present Illness:    Tiffany Allen is a 34 y.o. female with a hx of HTN and OSA who was referred by Dr. Juleen China for evaluation of chest pain.  ED note 11/26/19 reviewed. Patient presented with left sided chest pain. There, trop negative. ECG without ischemia. CXR without acute pathology. Low suspicion of ACS or PE and patient was discharged home with plans for follow-up in cardiology clinic.  Labs 11/2019: Na 139, K 4.4, Cr 0.88, WBC 12.2, HgB 14.4  Past Medical History:  Diagnosis Date  . Hypertension    does not take meds  . Narcolepsy   . OSA (obstructive sleep apnea)   . RLS (restless legs syndrome)     No past surgical history on file.  Current Medications: No outpatient medications have been marked as taking for the 03/26/20 encounter (Appointment) with Meriam Sprague, MD.     Allergies:   Adhesive [tape], Diphenhydramine hcl, Ibuprofen, and Latex   Social History   Socioeconomic History  . Marital status: Single    Spouse name: Not on file  . Number of children: Not on file  . Years of education: Not on file  . Highest education level: Not on file  Occupational History  . Not on file  Tobacco Use  . Smoking status: Current Every Day Smoker    Packs/day: 0.50    Years: 8.00    Pack years: 4.00    Types: Cigarettes  . Smokeless tobacco: Never Used  Vaping Use  . Vaping Use: Never used  Substance and Sexual Activity  . Alcohol use: No  . Drug use: No  . Sexual activity: Yes  Other Topics Concern  . Not on file  Social History Narrative  . Not on file   Social Determinants of Health   Financial Resource Strain:   . Difficulty of Paying Living Expenses:  Not on file  Food Insecurity:   . Worried About Programme researcher, broadcasting/film/video in the Last Year: Not on file  . Ran Out of Food in the Last Year: Not on file  Transportation Needs:   . Lack of Transportation (Medical): Not on file  . Lack of Transportation (Non-Medical): Not on file  Physical Activity:   . Days of Exercise per Week: Not on file  . Minutes of Exercise per Session: Not on file  Stress:   . Feeling of Stress : Not on file  Social Connections:   . Frequency of Communication with Friends and Family: Not on file  . Frequency of Social Gatherings with Friends and Family: Not on file  . Attends Religious Services: Not on file  . Active Member of Clubs or Organizations: Not on file  . Attends Banker Meetings: Not on file  . Marital Status: Not on file     Family History: The patient's ***family history includes Atrial fibrillation in her mother; Diabetes in her paternal grandmother.  ROS:   Please see the history of present illness.    *** All other systems reviewed and are negative.  EKGs/Labs/Other Studies Reviewed:    The following studies were reviewed today: CT abd/pelvis 2009: Findings: The lung bases are clear.  No evidence for free air. The liver, portal venous system, gallbladder, spleen, stomach, adrenal glands, and kidneys are within normal limits. No evidence for free fluid or lymphadenopathy. Small tubular structure in lower abdomen probably represents the appendix. There appears to be a focal area of fat near the pancreatic head. No acute bone abnormalities.  IMPRESSION:  No acute CT findings.  PELVIS CT WITH CONTRAST:  Technique: Multidetector CT imaging of the pelvis was performed following the standard protocol during bolus administration of intravenous contrast.  Findings: There is a trace amount of free fluid. Normal appearance of the adnexal tissue and uterus. No acute bone abnormalities.  IMPRESSION:  No acute pelvic findings.  EKG:   EKG is *** ordered today.  The ekg ordered today demonstrates ***  Recent Labs: 11/26/2019: BUN 12; Creatinine, Ser 0.88; Hemoglobin 14.4; Platelets 300; Potassium 4.4; Sodium 139  Recent Lipid Panel No results found for: CHOL, TRIG, HDL, CHOLHDL, VLDL, LDLCALC, LDLDIRECT   Risk Assessment/Calculations:   {Does this patient have ATRIAL FIBRILLATION?:[id number]}   Physical Exam:    VS:  There were no vitals taken for this visit.    Wt Readings from Last 3 Encounters:  09/16/19 (!) 340 lb (154.2 kg)  04/30/17 283 lb 6.4 oz (128.5 kg)  03/04/17 281 lb (127.5 kg)     GEN: *** Well nourished, well developed in no acute distress HEENT: Normal NECK: No JVD; No carotid bruits LYMPHATICS: No lymphadenopathy CARDIAC: ***RRR, no murmurs, rubs, gallops RESPIRATORY:  Clear to auscultation without rales, wheezing or rhonchi  ABDOMEN: Soft, non-tender, non-distended MUSCULOSKELETAL:  No edema; No deformity  SKIN: Warm and dry NEUROLOGIC:  Alert and oriented x 3 PSYCHIATRIC:  Normal affect   ASSESSMENT:    No diagnosis found. PLAN:    In order of problems listed above:  1. ***   Medication Adjustments/Labs and Tests Ordered: Current medicines are reviewed at length with the patient today.  Concerns regarding medicines are outlined above.  No orders of the defined types were placed in this encounter.  No orders of the defined types were placed in this encounter.   There are no Patient Instructions on file for this visit.   Signed, Meriam Sprague, MD  03/25/2020 2:02 PM    [facility]

## 2020-03-26 ENCOUNTER — Ambulatory Visit: Payer: Self-pay | Admitting: Cardiology

## 2020-04-11 ENCOUNTER — Encounter: Payer: Self-pay | Admitting: Internal Medicine

## 2020-04-11 ENCOUNTER — Ambulatory Visit (INDEPENDENT_AMBULATORY_CARE_PROVIDER_SITE_OTHER): Payer: Self-pay | Admitting: Internal Medicine

## 2020-04-11 ENCOUNTER — Other Ambulatory Visit: Payer: Self-pay

## 2020-04-11 ENCOUNTER — Encounter: Payer: Self-pay | Admitting: *Deleted

## 2020-04-11 DIAGNOSIS — R0602 Shortness of breath: Secondary | ICD-10-CM

## 2020-04-11 DIAGNOSIS — I10 Essential (primary) hypertension: Secondary | ICD-10-CM

## 2020-04-11 DIAGNOSIS — Z720 Tobacco use: Secondary | ICD-10-CM | POA: Insufficient documentation

## 2020-04-11 NOTE — Progress Notes (Signed)
Cardiology Office Note:    Date:  04/11/2020   ID:  Tiffany Allen, DOB 1986-05-28, MRN [id number]  PCP:  Default, Provider, MD  [facility] Cardiologist:  No primary care provider on file.   Referring MD: Raeford Razor, MD   CC: chest pain Consulted for the evaluation of chest pain at the behest of Default, Provider, MD  History of Present Illness:    Tiffany Allen is a 34 y.o. female with a hx of OSA +/- CPAP; Morbid Obesity, HTN, Tobacco Abuse who presents with after ED evaluation for CP.   Patient notes that she has chest pain at rest. Sharp pain that felt like a knife at rest.  Improved with aspirin and laid of her side.  Pain is sternal without radiation.  Accompanied by shortness of breath.  Has had little activity; unclear if there is an activity trigger. No syncope. With activity such as cleaning; has significant shortness of breath.  Past Medical History:  Diagnosis Date  . Hypertension    does not take meds  . Narcolepsy   . OSA (obstructive sleep apnea)   . RLS (restless legs syndrome)    No past surgical history on file.  Current Medications: Current Meds  Medication Sig  . ASPIRIN PO Take 1 tablet by mouth daily as needed (for chest pain).    Allergies:   Adhesive [tape], Diphenhydramine hcl, Ibuprofen, and Latex   Social History   Socioeconomic History  . Marital status: Single    Spouse name: Not on file  . Number of children: Not on file  . Years of education: Not on file  . Highest education level: Not on file  Occupational History  . Not on file  Tobacco Use  . Smoking status: Current Every Day Smoker    Packs/day: 0.50    Years: 8.00    Pack years: 4.00    Types: Cigarettes  . Smokeless tobacco: Never Used  Vaping Use  . Vaping Use: Never used  Substance and Sexual Activity  . Alcohol use: No  . Drug use: No  . Sexual activity: Yes  Other Topics Concern  . Not on file  Social History Narrative  . Not on file   Social  Determinants of Health   Financial Resource Strain:   . Difficulty of Paying Living Expenses: Not on file  Food Insecurity:   . Worried About Programme researcher, broadcasting/film/video in the Last Year: Not on file  . Ran Out of Food in the Last Year: Not on file  Transportation Needs:   . Lack of Transportation (Medical): Not on file  . Lack of Transportation (Non-Medical): Not on file  Physical Activity:   . Days of Exercise per Week: Not on file  . Minutes of Exercise per Session: Not on file  Stress:   . Feeling of Stress : Not on file  Social Connections:   . Frequency of Communication with Friends and Family: Not on file  . Frequency of Social Gatherings with Friends and Family: Not on file  . Attends Religious Services: Not on file  . Active Member of Clubs or Organizations: Not on file  . Attends Banker Meetings: Not on file  . Marital Status: Not on file    Family History: The patient's family history includes Atrial fibrillation in her mother; Diabetes in her paternal grandmother. Father died of heart attack age 46.  ROS:   Please see the history of present illness.  All other systems reviewed and are negative.  EKGs/Labs/Other Studies Reviewed:    The following studies were reviewed today:  EKG:  EKG is ordered today.  The ekg ordered today demonstrates SR rate 98 without ST/T changes 11/26/19:  SR 83 lateral T wave flattening  Recent Labs: 11/26/2019: BUN 12; Creatinine, Ser 0.88; Hemoglobin 14.4; Platelets 300; Potassium 4.4; Sodium 139   Physical Exam:    VS:  BP 118/64   Pulse 99   Ht 5\' 2"  (1.575 m)   Wt (!) 315 lb 6.4 oz (143.1 kg)   BMI 57.69 kg/m     Wt Readings from Last 3 Encounters:  04/11/20 (!) 315 lb 6.4 oz (143.1 kg)  09/16/19 (!) 340 lb (154.2 kg)  04/30/17 283 lb 6.4 oz (128.5 kg)    GEN: Obese well developed in no acute distress HEENT: Normal NECK: No JVD; No carotid bruits LYMPHATICS: No lymphadenopathy CARDIAC: RRR, no murmurs, rubs,  gallops RESPIRATORY:  Clear to auscultation without rales, wheezing or rhonchi  ABDOMEN: Soft, non-tender, non-distended MUSCULOSKELETAL:  No edema; No deformity  SKIN: Warm and dry NEUROLOGIC:  Alert and oriented x 3 PSYCHIATRIC:  Normal affect   ASSESSMENT:    1. Morbid obesity due to excess calories (HCC)   2. Essential hypertension   3. Tobacco abuse   4. SOB (shortness of breath)    PLAN:    In order of problems listed above:  Chest Pain Morbid Obesity HTN Tobacco Abuse Social Determinants of Health- no insurance - The patient presents with atypical chest pain but with risk factors - would also benefit from structural heart evaluation  - CVD risk factors include early family history.  - Will get lipid panel - continue 81 mg aspirin - Would recommend a, exercise  nuclear medicine stress test (NPO at [facility]); discussed risks, benefits, and alternatives of the diagnostic procedure including chest pain, arrhythmia, and death.  Patient amenable for testing. - assisted in getting patient to PCP; patient has worked on [facility]  Tobacco Abuse 1. The patient was counseled on the dangers of tobacco use, both inhaled and oral, which include, but are not limited to cardiovascular disease, increased cancer risk of multiple types of cancer, COPD, peripheral vascular disease, strokes. 2. She was also counseled on the benefits of smoking cessation. 3. The patient was firmly advised to quit.    4. We also reviewed strategies to maximize success, including:  Removing cigarettes and smoking materials from environment  Stress management  Substitution of other forms of reinforcement Support of family/friends.  Selecting a quit date.  Patient provided contact information for 1-800-QUIT-NOW   4-5 follow up unless new symptoms or abnormal test results warranting change in plan  Would be reasonable for Virtual Follow up  Medication Adjustments/Labs and Tests Ordered: Current  medicines are reviewed at length with the patient today.  Concerns regarding medicines are outlined above.  No orders of the defined types were placed in this encounter.  No orders of the defined types were placed in this encounter.   There are no Patient Instructions on file for this visit.   Signed, [employer], MD  04/11/2020 4:31 PM    [facility]

## 2020-04-11 NOTE — Patient Instructions (Signed)
Medication Instructions:   Your physician recommends that you continue on your current medications as directed. Please refer to the Current Medication list given to you today.  *If you need a refill on your cardiac medications before your next appointment, please call your pharmacy*  Lab Work:  ASAP HERE IN THE OFFICE--CAN SCHEDULE SAME DAY AS YOUR EXERCISE STRESS TEST--WE WILL CHECK LIPIDS--PLEASE COME FASTING TO THIS LAB APPOINTMENT.   If you have labs (blood work) drawn today and your tests are completely normal, you will receive your results only by: Marland Kitchen MyChart Message (if you have MyChart) OR . A paper copy in the mail If you have any lab test that is abnormal or we need to change your treatment, we will call you to review the results.  Testing/Procedures:  Your physician has requested that you have en exercise stress myoview. For further information please visit [email or web address] Please follow instruction sheet, as given.  PLEASE HAVE YOUR LAB DONE SAME DAY.    Follow-Up:  4-5 MONTHS IN THE OFFICE WITH DR. [facility]    Other Instructions  --YOU CALL CALL [facility] AT [phone], TO ESTABLISH WITH A PCP--

## 2020-04-18 ENCOUNTER — Telehealth (HOSPITAL_COMMUNITY): Payer: Self-pay

## 2020-04-18 NOTE — Telephone Encounter (Signed)
Spoke with the patient, detailed instructions given. She stated she understood and would be here for her test. Asked to call back with any questions. S.Joselyn Edling EMTP ?

## 2020-04-20 ENCOUNTER — Other Ambulatory Visit (HOSPITAL_COMMUNITY)
Admission: RE | Admit: 2020-04-20 | Discharge: 2020-04-20 | Disposition: A | Discharge status: Home / Self Care | Payer: HRSA Program | Source: Ambulatory Visit | Attending: Internal Medicine | Admitting: Internal Medicine

## 2020-04-20 DIAGNOSIS — Z20822 Contact with and (suspected) exposure to covid-19: Secondary | ICD-10-CM | POA: Insufficient documentation

## 2020-04-20 DIAGNOSIS — Z01812 Encounter for preprocedural laboratory examination: Secondary | ICD-10-CM | POA: Diagnosis present

## 2020-04-20 LAB — SARS CORONAVIRUS 2 (TAT 6-24 HRS): SARS Coronavirus 2: NEGATIVE

## 2020-04-24 ENCOUNTER — Other Ambulatory Visit: Payer: Self-pay

## 2020-04-24 ENCOUNTER — Ambulatory Visit (HOSPITAL_COMMUNITY): Payer: Self-pay

## 2020-04-24 ENCOUNTER — Ambulatory Visit (HOSPITAL_COMMUNITY): Payer: Self-pay | Attending: Cardiology

## 2020-04-24 DIAGNOSIS — Z720 Tobacco use: Secondary | ICD-10-CM

## 2020-04-24 DIAGNOSIS — R0602 Shortness of breath: Secondary | ICD-10-CM | POA: Insufficient documentation

## 2020-04-24 DIAGNOSIS — I10 Essential (primary) hypertension: Secondary | ICD-10-CM

## 2020-04-24 LAB — LIPID PANEL
Chol/HDL Ratio: 3.1 ratio (ref 0.0–4.4)
Cholesterol, Total: 137 mg/dL (ref 100–199)
HDL: 44 mg/dL (ref >39)
LDL Chol Calc (NIH): 79 mg/dL (ref 0–99)
Triglycerides: 66 mg/dL (ref 0–149)
VLDL Cholesterol Cal: 14 mg/dL (ref 5–40)

## 2020-04-24 MED ORDER — TECHNETIUM TC 99M TETROFOSMIN IV KIT
30.6000 | PACK | Freq: Once | INTRAVENOUS | Status: AC | PRN
  Administered 2020-04-24: 30.6 via INTRAVENOUS
  Filled 2020-04-24: qty 31

## 2020-04-24 MED ORDER — REGADENOSON 0.4 MG/5ML IV SOLN
0.4000 mg | Freq: Once | INTRAVENOUS | Status: AC
  Administered 2020-04-24: 0.4 mg via INTRAVENOUS

## 2020-04-25 ENCOUNTER — Ambulatory Visit (HOSPITAL_COMMUNITY): Payer: Self-pay | Attending: Internal Medicine

## 2020-04-25 LAB — MYOCARDIAL PERFUSION IMAGING
LV dias vol: 60 mL (ref 46–106)
LV sys vol: 21 mL
Peak HR: 137 {beats}/min
Rest HR: 113 {beats}/min
SDS: 1
SRS: 2
SSS: 3
TID: 0.78

## 2020-04-25 MED ORDER — TECHNETIUM TC 99M TETROFOSMIN IV KIT
32.4000 | PACK | Freq: Once | INTRAVENOUS | Status: AC | PRN
  Administered 2020-04-25: 32.4 via INTRAVENOUS
  Filled 2020-04-25: qty 33

## 2020-05-18 ENCOUNTER — Ambulatory Visit (HOSPITAL_COMMUNITY)
Admission: RE | Admit: 2020-05-18 | Discharge: 2020-05-18 | Disposition: A | Discharge status: Home / Self Care | Payer: Self-pay | Source: Ambulatory Visit | Attending: Family Medicine | Admitting: Family Medicine

## 2020-05-18 ENCOUNTER — Encounter (HOSPITAL_COMMUNITY): Payer: Self-pay

## 2020-05-18 ENCOUNTER — Other Ambulatory Visit: Payer: Self-pay

## 2020-05-18 VITALS — BP 124/89 | HR 111 | Temp 99.1°F

## 2020-05-18 DIAGNOSIS — L0291 Cutaneous abscess, unspecified: Secondary | ICD-10-CM

## 2020-05-18 MED ORDER — DOXYCYCLINE HYCLATE 100 MG PO CAPS
100.0000 mg | ORAL_CAPSULE | Freq: Two times a day (BID) | ORAL | 0 refills | Status: DC
Start: 2020-05-18 — End: 2020-06-03

## 2020-05-18 MED ORDER — LIDOCAINE HCL 2 % IJ SOLN
INTRAMUSCULAR | Status: AC
  Filled 2020-05-18: qty 20

## 2020-05-18 NOTE — ED Triage Notes (Signed)
Pt c/o boil on left breast x 3 days. Pt states she recently had a boil on the right breast. Pt states her breast is red and very tender. She states she has fever but does not have a thermometer.

## 2020-05-18 NOTE — Discharge Instructions (Addendum)
Take the antibiotics as prescribed.  You can pull the packing out in 2 days.  Please follow-up for any continued or worsening problems

## 2020-05-21 NOTE — ED Provider Notes (Signed)
Renaldo Fiddler    CSN: [id number] Arrival date & time: 05/18/20  1356      History   Chief Complaint Chief Complaint  Patient presents with  . Abscess  . Appointment    2 pm    HPI Tiffany Allen is a 34 y.o. female.   Patient is a 34 year old female presents today with abscess to left breast area.  This is been present worsening of the last 3 days.  Significant swelling, redness.  Denies any drainage from the area.  Was seen here previously for similar abscess to the right breast.  Low-grade fever here today.  Has been using warm compresses.     Past Medical History:  Diagnosis Date  . Hypertension    does not take meds  . Narcolepsy   . OSA (obstructive sleep apnea)   . RLS (restless legs syndrome)     Patient Active Problem List   Diagnosis Date Noted  . Essential hypertension 04/11/2020  . Tobacco abuse 04/11/2020  . SOB (shortness of breath) 04/11/2020  . Morbid obesity due to excess calories (HCC) 01/27/2017  . OBSTRUCTIVE SLEEP APNEA 03/29/2008  . RESTLESS LEGS SYNDROME 03/29/2008  . 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 No pertinent surgical history.  OB History   No obstetric history on file.      Home Medications    Prior to Admission medications   Medication Sig Start Date End Date Taking? Authorizing Provider  ASPIRIN PO Take 1 tablet by mouth daily as needed (for chest pain).    [provider]  doxycycline (VIBRAMYCIN) 100 MG capsule Take 1 capsule (100 mg total) by mouth 2 (two) times daily. 05/18/20   Janace Aris, NP    Family History Family History  Problem Relation Age of Onset  . Atrial fibrillation Mother   . Diabetes Paternal Grandmother     Social History Social History   Tobacco Use  . Smoking status: Current Every Day Smoker    Packs/day: 0.50    Years: 8.00    Pack years: 4.00    Types: Cigarettes  . Smokeless tobacco: Never Used  Vaping Use  . Vaping Use: Never used    Substance Use Topics  . Alcohol use: No  . Drug use: No     Allergies   Adhesive [tape], Diphenhydramine hcl, Ibuprofen, and Latex   Review of Systems Review of Systems   Physical Exam Triage Vital Signs ED Triage Vitals  Enc Vitals Group     BP 05/18/20 1438 124/89     Pulse Rate 05/18/20 1438 (!) 111     Resp --      Temp 05/18/20 1438 99.1 F (37.3 C)     Temp Source 05/18/20 1438 Oral     SpO2 05/18/20 1438 97 %     Weight --      Height --      Head Circumference --      Peak Flow --      Pain Score 05/18/20 1436 10     Pain Loc --      Pain Edu? --      Excl. in GC? --    No data found.  Updated Vital Signs BP 124/89 (BP Location: Left Arm)   Pulse (!) 111   Temp 99.1 F (37.3 C) (Oral)   SpO2 97%   Visual Acuity Right Eye Distance:   Left Eye Distance:   Bilateral Distance:    Right  Eye Near:   Left Eye Near:    Bilateral Near:     Physical Exam Vitals and nursing note reviewed.  Constitutional:      General: She is not in acute distress.    Appearance: Normal appearance. She is not ill-appearing, toxic-appearing or diaphoretic.  HENT:     Head: Normocephalic.     Nose: Nose normal.  Eyes:     Conjunctiva/sclera: Conjunctivae normal.  Pulmonary:     Effort: Pulmonary effort is normal.  Chest:       Comments: Very large approximated 5-6 cm abscess to the left breast. Large amount of fluctuance. No drainage. Significant erythema to the breat and tenderness throughout.  Musculoskeletal:        General: Normal range of motion.     Cervical back: Normal range of motion.  Skin:    General: Skin is warm and dry.     Findings: No rash.  Neurological:     Mental Status: She is alert.  Psychiatric:        Mood and Affect: Mood normal.      UC Treatments / Results  Labs (all labs ordered are listed, but only abnormal results are displayed) Labs Reviewed - No data to display  EKG   Radiology No results  found.  Procedures Incision and Drainage  Date/Time: 05/21/2020 8:38 AM Performed by: Janace Aris, NP Authorized by: Janace Aris, NP   Consent:    Consent obtained:  Verbal   Consent given by:  Patient   Risks discussed:  Bleeding, incomplete drainage, pain and damage to other organs   Alternatives discussed:  No treatment Universal protocol:    Patient identity confirmed:  Verbally with patient Location:    Type:  Abscess   Size:  5 Pre-procedure details:    Skin preparation:  Betadine Anesthesia (see MAR for exact dosages):    Anesthesia method:  Local infiltration   Local anesthetic:  Lidocaine 2% w/o epi Procedure type:    Complexity:  Complex Procedure details:    Incision types:  Single straight   Incision depth:  Subcutaneous   Scalpel blade:  11   Wound management:  Probed and deloculated   Drainage:  Purulent   Drainage amount:  Copious   Wound treatment:  Wound left open   Packing materials:  1/4 in gauze Post-procedure details:    Patient tolerance of procedure:  Tolerated well, no immediate complications   (including critical care time)  Medications Ordered in UC Medications - No data to display  Initial Impression / Assessment and Plan / UC Course  I have reviewed the triage vital signs and the nursing notes.  Pertinent labs & imaging results that were available during my care of the patient were reviewed by me and considered in my medical decision making (see chart for details).     Abscess Large abscess to left breast. I&D performed here in clinic today.  Patient tolerated well. Will start on doxycycline for antibiotic coverage. Recommended pull the packing out in 2 days. Return for any worsening problems or issues Final Clinical Impressions(s) / UC Diagnoses   Final diagnoses:  Abscess     Discharge Instructions     Take the antibiotics as prescribed.  You can pull the packing out in 2 days.  Please follow-up for any continued or  worsening problems    ED Prescriptions    Medication Sig Dispense Auth. Provider   doxycycline (VIBRAMYCIN) 100 MG capsule Take 1 capsule (  100 mg total) by mouth 2 (two) times daily. 20 capsule Dahlia Byes A, NP     PDMP not reviewed this encounter.   Janace Aris, NP 05/21/20 [phone]

## 2020-06-03 ENCOUNTER — Encounter (HOSPITAL_COMMUNITY): Payer: Self-pay

## 2020-06-03 ENCOUNTER — Ambulatory Visit (HOSPITAL_COMMUNITY)
Admission: EM | Admit: 2020-06-03 | Discharge: 2020-06-03 | Disposition: A | Discharge status: Home / Self Care | Payer: Self-pay | Attending: Student | Admitting: Student

## 2020-06-03 DIAGNOSIS — H60501 Unspecified acute noninfective otitis externa, right ear: Secondary | ICD-10-CM

## 2020-06-03 MED ORDER — CIPROFLOXACIN-DEXAMETHASONE 0.3-0.1 % OT SUSP
4.0000 [drp] | Freq: Two times a day (BID) | OTIC | 0 refills | Status: DC
Start: 2020-06-03 — End: 2020-06-04

## 2020-06-03 MED ORDER — AMOXICILLIN-POT CLAVULANATE 875-125 MG PO TABS
1.0000 | ORAL_TABLET | Freq: Two times a day (BID) | ORAL | 0 refills | Status: DC
Start: 2020-06-03 — End: 2020-08-23

## 2020-06-03 NOTE — ED Triage Notes (Signed)
Pt c/o swelling on right side of face x 3 days, earaches 24 hours. Pt states she is concerned about the area where she had an abscess x 2 weeks ago. She states she feels a tingling sensation on the area.

## 2020-06-03 NOTE — Discharge Instructions (Addendum)
Keep your ear dry for 7 days. You can use cotton or an earplug to keep water out when bathing.

## 2020-06-03 NOTE — ED Provider Notes (Signed)
[facility]    CSN: [id number] Arrival date & time: 06/03/20  1408      History   Chief Complaint Chief Complaint  Patient presents with  . Otalgia  . Facial Swelling    Right side    HPI Tiffany Allen is a 34 y.o. female presenting with right-sided ear pain and facial swelling. States her face has been swollen for 3 days, and earaches for 1 day. Denies recent history of URI or swimming. Endorses muffled hearing. Scant yellow discharge. Denies dizziness, tinnitus. Denies fevers/chills, n/v/d, shortness of breath, chest pain, cough, congestion, facial pain, teeth pain, headaches, sore throat, loss of taste/smell, swollen lymph nodes, ear pain.   States that her left breast abscess seems to be healing but the skin is a bit darker over where the abscess was. Denies fevers/chills.   HPI  Past Medical History:  Diagnosis Date  . Hypertension    does not take meds  . Narcolepsy   . OSA (obstructive sleep apnea)   . RLS (restless legs syndrome)     Patient Active Problem List   Diagnosis Date Noted  . Essential hypertension 04/11/2020  . Tobacco abuse 04/11/2020  . SOB (shortness of breath) 04/11/2020  . Morbid obesity due to excess calories (HCC) 01/27/2017  . OBSTRUCTIVE SLEEP APNEA 03/29/2008  . RESTLESS LEGS SYNDROME 03/29/2008  . 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 No pertinent surgical history.  OB History   No obstetric history on file.      Home Medications    Prior to Admission medications   Medication Sig Start Date End Date Taking? Authorizing Provider  amoxicillin-clavulanate (AUGMENTIN) 875-125 MG tablet Take 1 tablet by mouth every 12 (twelve) hours. 06/03/20   Rhys Martini, PA-C  ASPIRIN PO Take 1 tablet by mouth daily as needed (for chest pain).    [provider]  ciprofloxacin-dexamethasone (CIPRODEX) OTIC suspension Place 4 drops into the right ear 2 (two) times daily. 06/03/20   Rhys Martini, PA-C    Family History Family History  Problem Relation Age of Onset  . Atrial fibrillation Mother   . Diabetes Paternal Grandmother     Social History Social History   Tobacco Use  . Smoking status: Current Every Day Smoker    Packs/day: 0.50    Years: 8.00    Pack years: 4.00    Types: Cigarettes  . Smokeless tobacco: Never Used  Vaping Use  . Vaping Use: Never used  Substance Use Topics  . Alcohol use: No  . Drug use: No     Allergies   Adhesive [tape], Diphenhydramine hcl, Ibuprofen, and Latex   Review of Systems Review of Systems  HENT: Positive for ear discharge, ear pain and facial swelling.   Skin: Positive for color change.  All other systems reviewed and are negative.    Physical Exam Triage Vital Signs ED Triage Vitals  Enc Vitals Group     BP 06/03/20 1616 (!) 150/109     Pulse Rate 06/03/20 1616 95     Resp 06/03/20 1616 18     Temp 06/03/20 1616 98.5 F (36.9 C)     Temp Source 06/03/20 1616 Oral     SpO2 06/03/20 1616 100 %     Weight --      Height --      Head Circumference --      Peak Flow --      Pain Score 06/03/20 1615 8  Pain Loc --      Pain Edu? --      Excl. in GC? --    No data found.  Updated Vital Signs BP (!) 150/109 (BP Location: Right Wrist)   Pulse 95   Temp 98.5 F (36.9 C) (Oral)   Resp 18   LMP  (LMP Unknown)   SpO2 100%   Visual Acuity Right Eye Distance:   Left Eye Distance:   Bilateral Distance:    Right Eye Near:   Left Eye Near:    Bilateral Near:     Physical Exam Vitals reviewed.  Constitutional:      General: She is not in acute distress.    Appearance: Normal appearance. She is well-developed. She is not ill-appearing.  HENT:     Head: Normocephalic and atraumatic.     Right Ear: Decreased hearing noted. Drainage, swelling and tenderness present. There is mastoid tenderness.     Left Ear: Hearing, tympanic membrane, ear canal and external ear normal. No drainage.  No middle ear  effusion. Tympanic membrane is not perforated, erythematous, retracted or bulging.     Ears:     Comments: Unable to visualize R TM due to swelling and patient discomfort.    Nose: Nose normal.     Right Sinus: No maxillary sinus tenderness or frontal sinus tenderness.     Left Sinus: No maxillary sinus tenderness or frontal sinus tenderness.     Mouth/Throat:     Mouth: Mucous membranes are moist.     Pharynx: Uvula midline. No oropharyngeal exudate, posterior oropharyngeal erythema or uvula swelling.     Tonsils: No tonsillar exudate.  Cardiovascular:     Rate and Rhythm: Normal rate and regular rhythm.     Heart sounds: Normal heart sounds.  Pulmonary:     Effort: Pulmonary effort is normal.     Breath sounds: Normal breath sounds and air entry.  Abdominal:     General: Bowel sounds are normal.     Palpations: Abdomen is soft.     Tenderness: There is no abdominal tenderness. There is no guarding or rebound. Negative signs include Murphy's sign and McBurney's sign.  Lymphadenopathy:     Cervical: No cervical adenopathy.  Skin:    Comments: Left breast with skin darkening over area of healed abscess. Abscess appears well healed, not erythematous, no warmth, no tenderness, no discharge, no fluctuance.   Neurological:     General: No focal deficit present.     Mental Status: She is alert.  Psychiatric:        Attention and Perception: Attention and perception normal.        Mood and Affect: Mood and affect normal.        Behavior: Behavior is cooperative.      UC Treatments / Results  Labs (all labs ordered are listed, but only abnormal results are displayed) Labs Reviewed - No data to display  EKG   Radiology No results found.  Procedures Procedures (including critical care time)  Medications Ordered in UC Medications - No data to display  Initial Impression / Assessment and Plan / UC Course  I have reviewed the triage vital signs and the nursing  notes.  Pertinent labs & imaging results that were available during my care of the patient were reviewed by me and considered in my medical decision making (see chart for details).     ciprodex and augmentin as below for acute otitis externa. Return precautions- chest pain, shortness  of breath, new/worsening fevers/chills, confusion, worsening of symptoms despite the above treatment plan, etc.   Breast abscess appears well-healed. Patient has completed course of doxycycline. Return precautions- new abscess, fevers, etc.   Final Clinical Impressions(s) / UC Diagnoses   Final diagnoses:  Acute non-infective otitis externa of right ear, unspecified type     Discharge Instructions     Keep your ear dry for 7 days. You can use cotton or an earplug to keep water out when bathing.     ED Prescriptions    Medication Sig Dispense Auth. Provider   amoxicillin-clavulanate (AUGMENTIN) 875-125 MG tablet Take 1 tablet by mouth every 12 (twelve) hours. 14 tablet Rhys Martini, PA-C   ciprofloxacin-dexamethasone (CIPRODEX) OTIC suspension Place 4 drops into the right ear 2 (two) times daily. 7.5 mL Rhys Martini, PA-C     PDMP not reviewed this encounter.   Rhys Martini, PA-C 06/03/20 1651

## 2020-06-04 ENCOUNTER — Telehealth (HOSPITAL_COMMUNITY): Payer: Self-pay | Admitting: Emergency Medicine

## 2020-06-04 MED ORDER — HYDROCORTISONE-ACETIC ACID 1-2 % OT SOLN
5.0000 [drp] | Freq: Three times a day (TID) | OTIC | 0 refills | Status: AC
Start: 2020-06-04 — End: 2020-06-09

## 2020-06-04 NOTE — Telephone Encounter (Signed)
Patient called stating she could not afford Ciprodex.  Reached out to Dr. Leonides Grills who provided new prescription.  Patient updated, verified pharmacy, all questions answered

## 2020-07-20 ENCOUNTER — Encounter: Payer: Self-pay | Admitting: Internal Medicine

## 2020-08-21 ENCOUNTER — Ambulatory Visit: Payer: Self-pay | Admitting: Internal Medicine

## 2020-08-23 ENCOUNTER — Other Ambulatory Visit: Payer: Self-pay

## 2020-08-23 ENCOUNTER — Encounter (INDEPENDENT_AMBULATORY_CARE_PROVIDER_SITE_OTHER): Payer: Self-pay

## 2020-08-23 ENCOUNTER — Encounter: Payer: Self-pay | Admitting: Internal Medicine

## 2020-08-23 ENCOUNTER — Ambulatory Visit (INDEPENDENT_AMBULATORY_CARE_PROVIDER_SITE_OTHER): Payer: Self-pay | Admitting: Internal Medicine

## 2020-08-23 VITALS — BP 130/80 | HR 103 | Ht 62.0 in | Wt 316.0 lb

## 2020-08-23 DIAGNOSIS — I10 Essential (primary) hypertension: Secondary | ICD-10-CM

## 2020-08-23 DIAGNOSIS — Z720 Tobacco use: Secondary | ICD-10-CM

## 2020-08-23 DIAGNOSIS — G4733 Obstructive sleep apnea (adult) (pediatric): Secondary | ICD-10-CM

## 2020-08-23 NOTE — Patient Instructions (Signed)
Medication Instructions:  Your physician recommends that you continue on your current medications as directed. Please refer to the Current Medication list given to you today.  *If you need a refill on your cardiac medications before your next appointment, please call your pharmacy*   Lab Work: NONE If you have labs (blood work) drawn today and your tests are completely normal, you will receive your results only by: . MyChart Message (if you have MyChart) OR . A paper copy in the mail If you have any lab test that is abnormal or we need to change your treatment, we will call you to review the results.   Testing/Procedures: NONE   Follow-Up: At [facility], you and your health needs are our priority.  As part of our continuing mission to provide you with exceptional heart care, we have created designated Provider Care Teams.  These Care Teams include your primary Cardiologist (physician) and Advanced Practice Providers (APPs -  Physician Assistants and Nurse Practitioners) who all work together to provide you with the care you need, when you need it.  We recommend signing up for the patient portal called "MyChart".  Sign up information is provided on this After Visit Summary.  MyChart is used to connect with patients for Virtual Visits (Telemedicine).  Patients are able to view lab/test results, encounter notes, upcoming appointments, etc.  Non-urgent messages can be sent to your provider as well.   To learn more about what you can do with MyChart, go to [email or web address].    Your next appointment:   12 month(s)  The format for your next appointment:   In Person  Provider:   You may see Chandrasekhar, MD or one of the following Advanced Practice Providers on your designated Care Team:    Dayna Dunn, PA-C  Michele Lenze, PA-C       

## 2020-08-23 NOTE — Progress Notes (Signed)
Cardiology Office Note:    Date:  08/23/2020   ID:  Tiffany Allen, DOB 1986/03/22, MRN [id number]  PCP:  Default, Provider, MD  [facility] Cardiologist:  Riley Lam MD  Referring MD: No ref. provider found   CC: F/U CP  History of Present Illness:    Tiffany Allen is a 35 y.o. female with a hx of OSA +/- CPAP; Morbid Obesity, HTN, Tobacco Abuse who presents with after ED evaluation 04/11/20.  In interim of this visit, patient had negative stress test.  Patient notes that she is still having pain.  Since last visit notes no pain with stress test.  Relevant interval testing or therapy include no testing.  There are no interval hospital/ED visit.    Notes that her chest pains were with sweeping and moping and with constant movement.  Notes DOE with cleaning her house. No PND/Orthopnea.   No palpitations or syncope .  Has changed to a lighter cigarette brand.  Ambulatory blood pressure not done.   Past Medical History:  Diagnosis Date  . Hypertension    does not take meds  . Narcolepsy   . OSA (obstructive sleep apnea)   . RLS (restless legs syndrome)    No past surgical history on file.  Current Medications: No outpatient medications have been marked as taking for the 08/23/20 encounter (Office Visit) with Christell Constant, MD.    Allergies:   Adhesive [tape], Diphenhydramine hcl, Ibuprofen, and Latex   Social History   Socioeconomic History  . Marital status: Single    Spouse name: Not on file  . Number of children: Not on file  . Years of education: Not on file  . Highest education level: Not on file  Occupational History  . Not on file  Tobacco Use  . Smoking status: Current Every Day Smoker    Packs/day: 0.50    Years: 8.00    Pack years: 4.00    Types: Cigarettes  . Smokeless tobacco: Never Used  Vaping Use  . Vaping Use: Never used  Substance and Sexual Activity  . Alcohol use: No  . Drug use: No  . Sexual activity: Yes  Other  Topics Concern  . Not on file  Social History Narrative  . Not on file   Social Determinants of Health   Financial Resource Strain: Not on file  Food Insecurity: Not on file  Transportation Needs: Not on file  Physical Activity: Not on file  Stress: Not on file  Social Connections: Not on file    Social: Comes with Godmother  Family History: The patient's family history includes Atrial fibrillation in her mother; Diabetes in her paternal grandmother. Father died of heart attack age 69.  ROS:   Please see the history of present illness.    All other systems reviewed and are negative.  EKGs/Labs/Other Studies Reviewed:    The following studies were reviewed today:  EKG:   04/11/20 SR rate 98 without ST/T changes 11/26/19:  SR 83 lateral T wave flattening   NM Stress Testing : Date:08/23/20 Results:  Nuclear stress EF: 66%.  The left ventricular ejection fraction is hyperdynamic (>65%).  There was no ST segment deviation noted during stress.  No T wave inversion was noted during stress.  The study is normal.  This is a low risk study.   No evidence of ischemia or infarction.   Recent Labs: 11/26/2019: BUN 12; Creatinine, Ser 0.88; Hemoglobin 14.4; Platelets 300; Potassium 4.4; Sodium 139  Physical Exam:    VS:  BP 130/80   Pulse (!) 103   Ht 5\' 2"  (1.575 m)   Wt (!) 316 lb (143.3 kg)   SpO2 97%   BMI 57.80 kg/m     Wt Readings from Last 3 Encounters:  08/23/20 (!) 316 lb (143.3 kg)  04/24/20 (!) 315 lb (142.9 kg)  04/11/20 (!) 315 lb 6.4 oz (143.1 kg)    GEN: Obese well developed in no acute distress HEENT: Normal  NECK: No JVD; No carotid bruits LYMPHATICS: No lymphadenopathy CARDIAC: RRR, no murmurs, rubs, gallops RESPIRATORY:  Clear to auscultation without rales, wheezing or rhonchi  ABDOMEN: Soft, non-tender, non-distended MUSCULOSKELETAL:  No edema; No deformity  SKIN: Warm and dry NEUROLOGIC:  Alert and oriented x 3 PSYCHIATRIC:  Normal  affect   ASSESSMENT:    1. Essential hypertension   2. Tobacco abuse   3. Morbid obesity due to excess calories (HCC)   4. OBSTRUCTIVE SLEEP APNEA    PLAN:    In order of problems listed above:  Essential Hypertension OSA with Narcolepsy Morbid Obesity - ambulatory blood pressure not done , will start ambulatory BP monitoring; gave education on how to perform ambulatory blood pressure monitoring including the frequency and technique; goal ambulatory blood pressure < 135/85 on average - continue home medications  - discussed diet (DASH/low sodium), and exercise/weight loss interventions   Tobacco Abuse 1. The patient was counseled on the dangers of tobacco use, both inhaled and oral, which include, but are not limited to cardiovascular disease, increased cancer risk of multiple types of cancer, COPD, peripheral vascular disease, strokes. 2. She was also counseled on the benefits of smoking cessation. 3. The patient was firmly advised to quit.    4. We also reviewed strategies to maximize success, including:  Removing cigarettes and smoking materials from environment  Stress management  Substitution of other forms of reinforcement Support of family/friends.  Selecting a quit date.  Patient provided contact information for 1-800-QUIT-NOW   Social Determinants of Health- no insurance 04/13/20 is still pending  One year follow up unless new symptoms or abnormal test results warranting change in plan  Would be reasonable for  APP Follow up  Time Spent Directly with Patient:   I have spent a total of 40 with the patient reviewing hospital notes, telemetry, EKGs, labs and examining the patient as well as establishing an assessment and plan that was discussed personally with the patient.  > 50% of time was spent in direct patient care discussing exercise and smoking cessation with patient and Godmother.   Medication Adjustments/Labs and Tests Ordered: Current medicines are  reviewed at length with the patient today.  Concerns regarding medicines are outlined above.  No orders of the defined types were placed in this encounter.  No orders of the defined types were placed in this encounter.   Patient Instructions  Medication Instructions:  Your physician recommends that you continue on your current medications as directed. Please refer to the Current Medication list given to you today.  *If you need a refill on your cardiac medications before your next appointment, please call your pharmacy*   Lab Work: NONE If you have labs (blood work) drawn today and your tests are completely normal, you will receive your results only by: [employer] MyChart Message (if you have MyChart) OR . A paper copy in the mail If you have any lab test that is abnormal or we need to change your treatment, we  will call you to review the results.   Testing/Procedures: NONE   Follow-Up: At [facility], you and your health needs are our priority.  As part of our continuing mission to provide you with exceptional heart care, we have created designated Provider Care Teams.  These Care Teams include your primary Cardiologist (physician) and Advanced Practice Providers (APPs -  Physician Assistants and Nurse Practitioners) who all work together to provide you with the care you need, when you need it.  We recommend signing up for the patient portal called "MyChart".  Sign up information is provided on this After Visit Summary.  MyChart is used to connect with patients for Virtual Visits (Telemedicine).  Patients are able to view lab/test results, encounter notes, upcoming appointments, etc.  Non-urgent messages can be sent to your provider as well.   To learn more about what you can do with MyChart, go to [email or web address].    Your next appointment:   12 month(s)  The format for your next appointment:   In Person  Provider:   You may see Izora Ribas, MD or one of the following  Advanced Practice Providers on your designated Care Team:    Ronie Spies, PA-C  Jacolyn Reedy, PA-C           Signed, Christell Constant, MD  08/23/2020 10:55 AM    [facility]

## 2020-11-29 ENCOUNTER — Encounter (HOSPITAL_COMMUNITY): Payer: Self-pay

## 2020-11-29 ENCOUNTER — Ambulatory Visit (HOSPITAL_COMMUNITY)
Admission: EM | Admit: 2020-11-29 | Discharge: 2020-11-29 | Disposition: A | Discharge status: Home / Self Care | Payer: Self-pay | Attending: Internal Medicine | Admitting: Internal Medicine

## 2020-11-29 ENCOUNTER — Other Ambulatory Visit: Payer: Self-pay

## 2020-11-29 DIAGNOSIS — N912 Amenorrhea, unspecified: Secondary | ICD-10-CM

## 2020-11-29 DIAGNOSIS — L732 Hidradenitis suppurativa: Secondary | ICD-10-CM

## 2020-11-29 LAB — POC URINE PREG, ED: Preg Test, Ur: NEGATIVE

## 2020-11-29 LAB — POCT URINALYSIS DIPSTICK, ED / UC
Bilirubin Urine: NEGATIVE
Glucose, UA: NEGATIVE mg/dL
Leukocytes,Ua: NEGATIVE
Nitrite: NEGATIVE
Protein, ur: NEGATIVE mg/dL
Specific Gravity, Urine: >=1.03 (ref 1.005–1.030)
Urobilinogen, UA: 0.2 mg/dL (ref 0.0–1.0)
pH: 5.5 (ref 5.0–8.0)

## 2020-11-29 MED ORDER — DOXYCYCLINE HYCLATE 100 MG PO CAPS
100.0000 mg | ORAL_CAPSULE | Freq: Two times a day (BID) | ORAL | 0 refills | Status: DC
Start: 2020-11-29 — End: 2022-03-10

## 2020-11-29 NOTE — Discharge Instructions (Addendum)
Take the Doxycycline twice a day for the next 10 days.   You can take ibuprofen and/or Tylenol as needed for pain relief and fever reduction.  Apply warm compress to any abscesses 3-4 times a day.    Follow-up with [facility] for further evaluation of your lack period.  Return or go to the Emergency Department if symptoms worsen or do not improve in the next few days.

## 2020-11-29 NOTE — ED Notes (Signed)
Pt unable to urinate at this time. Given cup of water.

## 2020-11-29 NOTE — ED Provider Notes (Signed)
[facility]    CSN: [id number] Arrival date & time: 11/29/20  1429      History   Chief Complaint Chief Complaint  Patient presents with   Fever   Urinary Frequency   Hematuria    HPI Tiffany Allen is a 35 y.o. female.   Patient here for evaluation of possible HS flare as well as hematuria, urgency, frequency, and lower abdominal pain.  Also reports having a fever at home for the past week.  Reports having "boils" that have developed all over body, including behind left ear, bilateral arm pits, below breasts, and in groin.  Denies any trauma, injury, or other precipitating event.  Denies any specific alleviating or aggravating factors.  Denies any fevers, chest pain, shortness of breath, N/V/D, numbness, tingling, weakness, abdominal pain, or headaches.     The history is provided by the patient.  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 No pertinent surgical history.  OB History   No obstetric history on file.      Home Medications    Prior to Admission medications   Medication Sig Start Date End Date Taking? Authorizing Provider  doxycycline (VIBRAMYCIN) 100 MG capsule Take 1 capsule (100 mg total) by mouth 2 (two) times daily. 11/29/20  Yes Ivette Loyal, NP  ASPIRIN PO Take 1 tablet by mouth daily as needed (for chest pain).    [provider]    Family History Family History  Problem Relation Age of Onset   Atrial fibrillation  Mother    Diabetes Paternal Grandmother     Social History Social History   Tobacco Use   Smoking status: Every Day    Packs/day: 0.50    Years: 8.00    Pack years: 4.00    Types: Cigarettes   Smokeless tobacco: Never  Vaping Use   Vaping Use: Never used  Substance Use Topics   Alcohol use: No   Drug use: No     Allergies   Adhesive [tape], Diphenhydramine hcl, Ibuprofen, and Latex   Review of Systems Review of Systems  Constitutional:  Positive for fever.  Genitourinary:  Positive for frequency and hematuria.  Skin:  Positive for wound.  All other systems reviewed and are negative.   Physical Exam Triage Vital Signs ED Triage Vitals  Enc Vitals Group     BP 11/29/20 1445 121/87     Pulse Rate 11/29/20 1442 (!) 101     Resp 11/29/20 1442 20     Temp 11/29/20 1442 99.6 F (37.6 C)     Temp Source 11/29/20 1442 Oral     SpO2 11/29/20 1442 98 %     Weight --      Height --      Head Circumference --      Peak Flow --      Pain Score 11/29/20 1440 0     Pain Loc --  Pain Edu? --      Excl. in GC? --    No data found.  Updated Vital Signs BP 121/87 (BP Location: Right Arm)   Pulse (!) 101   Temp 99.6 F (37.6 C) (Oral)   Resp 20   SpO2 98%   Visual Acuity Right Eye Distance:   Left Eye Distance:   Bilateral Distance:    Right Eye Near:   Left Eye Near:    Bilateral Near:     Physical Exam Vitals and nursing note reviewed.  Constitutional:      General: She is not in acute distress.    Appearance: Normal appearance. She is not ill-appearing, toxic-appearing or diaphoretic.  HENT:     Head: Normocephalic and atraumatic.  Eyes:     Conjunctiva/sclera: Conjunctivae normal.  Cardiovascular:     Rate and Rhythm: Normal rate.     Pulses: Normal pulses.  Pulmonary:     Effort: Pulmonary effort is normal.  Abdominal:     General: Abdomen is flat.  Musculoskeletal:        General: Normal range of motion.     Cervical back: Normal range  of motion.  Skin:    General: Skin is warm and dry.     Findings: Abscess (multiple abscesses, behind left ear, in bilateral axilla, beneath both breasts, and in groin) present.  Neurological:     General: No focal deficit present.     Mental Status: She is alert and oriented to person, place, and time.  Psychiatric:        Mood and Affect: Mood normal.     UC Treatments / Results  Labs (all labs ordered are listed, but only abnormal results are displayed) Labs Reviewed  POCT URINALYSIS DIPSTICK, ED / UC - Abnormal; Notable for the following components:      Result Value   Ketones, ur TRACE (*)    Hgb urine dipstick SMALL (*)    All other components within normal limits  POC URINE PREG, ED    EKG   Radiology No results found.  Procedures Procedures (including critical care time)  Medications Ordered in UC Medications - No data to display  Initial Impression / Assessment and Plan / UC Course  I have reviewed the triage vital signs and the nursing notes.  Pertinent labs & imaging results that were available during my care of the patient were reviewed by me and considered in my medical decision making (see chart for details).    Assessment negative for red flags or concerns.  Urinalysis positive ketones for hgb but otherwise negative. Urine pregnancy test negative.  Likely HS flare.  Will treat with doxycycline twice a day for the next 10 days.  May apply a warm compress to any abscesses 3-4 times a day for comfort.  May take Ibuprofen and/or Tylenol as needed for pain and fever.  Follow up with OB/GYN for further evaluation of amenorrhea.  Final Clinical Impressions(s) / UC Diagnoses   Final diagnoses:  Hidradenitis suppurativa  Amenorrhea     Discharge Instructions      Take the Doxycycline twice a day for the next 10 days.   You can take ibuprofen and/or Tylenol as needed for pain relief and fever reduction.  Apply warm compress to any abscesses 3-4 times a  day.    Follow-up with [facility] for further evaluation of your lack period.  Return or go to the Emergency Department if symptoms worsen or do not improve  in the next few days.      ED Prescriptions     Medication Sig Dispense Auth. Provider   doxycycline (VIBRAMYCIN) 100 MG capsule Take 1 capsule (100 mg total) by mouth 2 (two) times daily. 20 capsule Ivette Loyal, NP      PDMP not reviewed this encounter.   Ivette Loyal, NP 11/29/20 1551

## 2020-11-29 NOTE — ED Triage Notes (Signed)
Pt in with c/o subjective forever x 1 week  Pt also c/o HS flare up, states she has boils all over her body   Pt also c/o blood in urine, back pain, abdominal pain and urinary frequency

## 2020-12-08 ENCOUNTER — Encounter (HOSPITAL_COMMUNITY): Payer: Self-pay | Admitting: Emergency Medicine

## 2020-12-08 ENCOUNTER — Emergency Department (HOSPITAL_COMMUNITY)
Admission: EM | Admit: 2020-12-08 | Discharge: 2020-12-08 | Disposition: A | Discharge status: Home / Self Care | Payer: Self-pay | Attending: Emergency Medicine | Admitting: Emergency Medicine

## 2020-12-08 DIAGNOSIS — Z5321 Procedure and treatment not carried out due to patient leaving prior to being seen by health care provider: Secondary | ICD-10-CM | POA: Insufficient documentation

## 2020-12-08 DIAGNOSIS — R509 Fever, unspecified: Secondary | ICD-10-CM | POA: Insufficient documentation

## 2020-12-08 DIAGNOSIS — R319 Hematuria, unspecified: Secondary | ICD-10-CM | POA: Insufficient documentation

## 2020-12-08 LAB — COMPREHENSIVE METABOLIC PANEL
ALT: 16 U/L (ref 0–44)
AST: 13 U/L — ABNORMAL LOW (ref 15–41)
Albumin: 3.2 g/dL — ABNORMAL LOW (ref 3.5–5.0)
Alkaline Phosphatase: 74 U/L (ref 38–126)
Anion gap: 7 (ref 5–15)
BUN: 11 mg/dL (ref 6–20)
CO2: 22 mmol/L (ref 22–32)
Calcium: 9 mg/dL (ref 8.9–10.3)
Chloride: 106 mmol/L (ref 98–111)
Creatinine, Ser: 0.94 mg/dL (ref 0.44–1.00)
GFR, Estimated: >60 mL/min (ref >60)
Glucose, Bld: 102 mg/dL — ABNORMAL HIGH (ref 70–99)
Potassium: 4 mmol/L (ref 3.5–5.1)
Sodium: 135 mmol/L (ref 135–145)
Total Bilirubin: 0.5 mg/dL (ref 0.3–1.2)
Total Protein: 7.3 g/dL (ref 6.5–8.1)

## 2020-12-08 LAB — CBC WITH DIFFERENTIAL/PLATELET
Abs Immature Granulocytes: 0.03 10*3/uL (ref 0.00–0.07)
Basophils Absolute: 0.1 10*3/uL (ref 0.0–0.1)
Basophils Relative: 0 %
Eosinophils Absolute: 0.4 10*3/uL (ref 0.0–0.5)
Eosinophils Relative: 3 %
HCT: 44.6 % (ref 36.0–46.0)
Hemoglobin: 14 g/dL (ref 12.0–15.0)
Immature Granulocytes: 0 %
Lymphocytes Relative: 41 %
Lymphs Abs: 4.7 10*3/uL — ABNORMAL HIGH (ref 0.7–4.0)
MCH: 27.1 pg (ref 26.0–34.0)
MCHC: 31.4 g/dL (ref 30.0–36.0)
MCV: 86.3 fL (ref 80.0–100.0)
Monocytes Absolute: 0.6 10*3/uL (ref 0.1–1.0)
Monocytes Relative: 5 %
Neutro Abs: 5.6 10*3/uL (ref 1.7–7.7)
Neutrophils Relative %: 51 %
Platelets: 304 10*3/uL (ref 150–400)
RBC: 5.17 MIL/uL — ABNORMAL HIGH (ref 3.87–5.11)
RDW: 14.6 % (ref 11.5–15.5)
WBC: 11.3 10*3/uL — ABNORMAL HIGH (ref 4.0–10.5)
nRBC: 0 % (ref 0.0–0.2)

## 2020-12-08 LAB — URINALYSIS, ROUTINE W REFLEX MICROSCOPIC
Bilirubin Urine: NEGATIVE
Glucose, UA: NEGATIVE mg/dL
Hgb urine dipstick: NEGATIVE
Ketones, ur: 5 mg/dL — AB
Leukocytes,Ua: NEGATIVE
Nitrite: NEGATIVE
Protein, ur: NEGATIVE mg/dL
Specific Gravity, Urine: 1.025 (ref 1.005–1.030)
pH: 5 (ref 5.0–8.0)

## 2020-12-08 LAB — POC URINE PREG, ED: Preg Test, Ur: NEGATIVE

## 2020-12-08 NOTE — ED Triage Notes (Addendum)
C/o intermittent fever x 3 weeks with hematuria and various boils "all over".

## 2020-12-08 NOTE — ED Notes (Signed)
Patient states the wait is too long and she is leaving 

## 2020-12-08 NOTE — ED Provider Notes (Signed)
Emergency Medicine Provider Triage Evaluation Note  Tiffany Allen , a 35 y.o. female  was evaluated in triage.  Pt complains of intermittent hematuria and fevers over the last 3 weeks.  She also endorses increased urinary frequency.  Few days ago she had some right-sided abdominal pain that resolved.  She went to urgent care few days ago who prescribed her doxycycline to treat her hidradenitis suppurativa.  Has multiple areas though none are worse than usual.  No nausea or vomiting or flank pain.  No vaginal bleeding or discharge.  Review of Systems  Positive: Hematuria, fever Negative: Nausea, vomiting, vaginal bleeding  Physical Exam  BP 127/87 (BP Location: Left Arm)   Pulse 97   Temp 98.8 F (37.1 C) (Oral)   Resp 18   SpO2 92%  Gen:   Awake, no distress   Resp:  Normal effort  MSK:   Moves extremities without difficulty  Other:  Abdomen is soft and nontender.  Medical Decision Making  Medically screening exam initiated at 4:35 PM.  Appropriate orders placed.  [name] was informed that the remainder of the evaluation will be completed by another provider, this initial triage assessment does not replace that evaluation, and the importance of remaining in the ED until their evaluation is complete.     Karandeep Resende, [place] N, PA-C 12/08/20 1636    Arby Barrette, MD 12/13/20 [phone]

## 2020-12-10 LAB — URINE CULTURE: Culture: NO GROWTH

## 2021-01-02 ENCOUNTER — Ambulatory Visit (INDEPENDENT_AMBULATORY_CARE_PROVIDER_SITE_OTHER): Payer: Self-pay | Admitting: Family Medicine

## 2021-01-02 ENCOUNTER — Other Ambulatory Visit: Payer: Self-pay

## 2021-01-02 ENCOUNTER — Encounter: Payer: Self-pay | Admitting: Family Medicine

## 2021-01-02 VITALS — BP 128/104 | HR 110 | Ht 62.0 in | Wt 325.4 lb

## 2021-01-02 DIAGNOSIS — Z599 Problem related to housing and economic circumstances, unspecified: Secondary | ICD-10-CM

## 2021-01-02 DIAGNOSIS — N912 Amenorrhea, unspecified: Secondary | ICD-10-CM

## 2021-01-02 DIAGNOSIS — L732 Hidradenitis suppurativa: Secondary | ICD-10-CM

## 2021-01-02 DIAGNOSIS — E282 Polycystic ovarian syndrome: Secondary | ICD-10-CM

## 2021-01-02 LAB — POCT URINE PREGNANCY: Preg Test, Ur: NEGATIVE

## 2021-01-02 MED ORDER — CLINDAMYCIN PHOSPHATE 1 % EX GEL: Freq: Two times a day (BID) | CUTANEOUS | 0 refills | Status: DC

## 2021-01-02 NOTE — Patient Instructions (Addendum)
It was great seeing you today.  I am sorry you are having all these issues.  I have placed a referral for someone from our social work department to call you regarding your financial difficulties.  Regarding your at bedtime I have prescribed clindamycin gel.  For your PCOS I want you to get your records from the health department and bring them to be scanned into your file.  I like to follow-up with you in about a month.  If you have any questions or concerns please let me know.  I hope you have a wonderful afternoon!

## 2021-01-02 NOTE — Progress Notes (Signed)
    SUBJECTIVE:   CHIEF COMPLAINT / HPI:   New patient visit  Current concerns PCOS concerns. LMP 5 month ago  Hidradenitis suppurtiva   PMH Narcolepsy and sleep apnea Restless leg syndrome Sciatica Polycystic ovarian syndrome Hidradenitis suppurativa Anxiety   PSH None  Allergies None   Social history  Lives in [place]. Is unemployed at this time. 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 Is sexually active and sometimes uses protection. No children. Smokes 1/2 PPD for 20 years. Does not drink or use other drugs.   Family history Cancer- Paternal grandmother leukemia,  Cardiac issues- Paternal MI in his 26s, CHF in 42s from [facility]  Hypertension- father  Aneurysm- both grandmothers    OBJECTIVE:   BP (!) 128/104   Pulse (!) 110   Ht 5\' 2"  (1.575 m)   Wt (!) 325 lb 6.4 oz (147.6 kg)   SpO2 96%   BMI 59.52 kg/m   General: Well-appearing, obese 35 year old female, no acute distress Cardiac: Regular rate and rhythm, no murmurs appreciated Respiratory: Normal work of breathing, lungs clear to auscultation bilaterally Abdomen: Soft, nontender, positive bowel sounds MSK: No gross abnormalities Derm: Patient has healing scars from at bedtime in her axillary areas as well as underneath her breasts, no inflamed lesions noted at this time  ASSESSMENT/PLAN:   Hidradenitis suppurativa Patient has multiple healing lesions under breasts as well as in the axillary area and groin.  Recently finished prescription of doxycycline oral.  Provided patient with prescription for clindamycin gel.  We will follow-up as needed.  Financial difficulties Patient currently has considerable financial stress because she is unemployed and uninsured.  She reports that she has not been able to work because of narcolepsy.  She is not searching for a job at this time.  She would like any assistance available.  Referral placed for chronic care management and they will  reach out to her for assistance.  PCOS (polycystic ovarian syndrome) Patient reports history of PCOS.  She says that she has been on multiple birth controls without any benefit.  She also reports she had blood clots in her legs after being on birth control.  She is going to get her records from the health department and bring this to our clinic for them to be added to her chart.  She reports no.  Over the last 5 months.  Urine pregnancy test today was negative.  She is sexually active and we discussed safe sex.     30, MD [facility]

## 2021-01-03 ENCOUNTER — Telehealth: Payer: Self-pay | Admitting: *Deleted

## 2021-01-03 DIAGNOSIS — L732 Hidradenitis suppurativa: Secondary | ICD-10-CM | POA: Insufficient documentation

## 2021-01-03 DIAGNOSIS — Z599 Problem related to housing and economic circumstances, unspecified: Secondary | ICD-10-CM | POA: Insufficient documentation

## 2021-01-03 DIAGNOSIS — E282 Polycystic ovarian syndrome: Secondary | ICD-10-CM | POA: Insufficient documentation

## 2021-01-03 NOTE — Chronic Care Management (AMB) (Signed)
  Care Management   Outreach Note  01/03/2021 Name: Tiffany Allen MRN: [id number] DOB: [birth year]  Referred by: Default, Provider, MD Reason for referral : Care Coordination (Initial outreach to schedule referral with Licensed Clinical SW )   An unsuccessful telephone outreach was attempted today. The patient was referred to the case management team for assistance with care management and care coordination.   Follow Up Plan: The care management team will reach out to the patient again over the next 7 days.  If patient returns call to provider office, please advise to call Embedded Care Management Care Guide Gwenevere Ghazi at [phone].  Gwenevere Ghazi  Care Guide, Embedded Care Coordination [facility] Management  Direct Dial: [phone]

## 2021-01-03 NOTE — Assessment & Plan Note (Signed)
Patient reports history of PCOS.  She says that she has been on multiple birth controls without any benefit.  She also reports she had blood clots in her legs after being on birth control.  She is going to get her records from the health department and bring this to our clinic for them to be added to her chart.  She reports no.  Over the last 5 months.  Urine pregnancy test today was negative.  She is sexually active and we discussed safe sex.

## 2021-01-03 NOTE — Assessment & Plan Note (Signed)
Patient has multiple healing lesions under breasts as well as in the axillary area and groin.  Recently finished prescription of doxycycline oral.  Provided patient with prescription for clindamycin gel.  We will follow-up as needed.

## 2021-01-03 NOTE — Assessment & Plan Note (Signed)
Patient currently has considerable financial stress because she is unemployed and uninsured.  She reports that she has not been able to work because of narcolepsy.  She is not searching for a job at this time.  She would like any assistance available.  Referral placed for chronic care management and they will reach out to her for assistance.

## 2021-01-07 NOTE — Chronic Care Management (AMB) (Signed)
  Care Management   Note  01/07/2021 Name: Tiffany Allen MRN: [id number] DOB: [birth year]  Tiffany Allen is a 35 y.o. year old female who is a primary care patient of Derrel Nip, MD. I reached out to Tiffany Allen by phone today in response to a referral sent by Ms. [name]'s PCP, Derrel Nip, MD .    Ms. Kage was given information about care management services today including:  Care management services include personalized support from designated clinical staff supervised by her physician, including individualized plan of care and coordination with other care providers 24/7 contact phone numbers for assistance for urgent and routine care needs. The patient may stop care management services at any time by phone call to the office staff.  Patient agreed to services and verbal consent obtained.   Follow up plan: Telephone appointment with care management team member scheduled for:01/14/2021  [facility] Guide, Embedded Care Coordination [facility]  Care Management  Direct Dial: [phone]

## 2021-01-14 ENCOUNTER — Ambulatory Visit: Payer: Self-pay | Admitting: Licensed Clinical Social Worker

## 2021-01-14 DIAGNOSIS — Z7689 Persons encountering health services in other specified circumstances: Secondary | ICD-10-CM

## 2021-01-14 DIAGNOSIS — Z599 Problem related to housing and economic circumstances, unspecified: Secondary | ICD-10-CM

## 2021-01-14 NOTE — Chronic Care Management (AMB) (Signed)
Care Management Clinical Social Work Note  01/14/2021 Name: OLA RAAP MRN: [id number] DOB: Oct 10, 1985  [name] is a 35 y.o. year old female who is a primary care patient of Derrel Nip, MD.  The Care Management team was consulted for assistance with chronic disease management and coordination needs.  Engaged with patient by telephone for initial visit in response to provider referral for social work chronic care management and care coordination services  Consent to Services:  Ms. Poma was given information about Care Management services today including:  Care Management services includes personalized support from designated clinical staff supervised by her physician, including individualized plan of care and coordination with other care providers 24/7 contact phone numbers for assistance for urgent and routine care needs. The patient may stop case management services at any time by phone call to the office staff.  Patient agreed to services and consent obtained.   Assessment: Patient is currently experiencing symptoms of  stress which seems to be exacerbated by not having income or insurance to meet her medical needs. Patient reports not being able to keep a job due her narcolepsy.has applied but continues to be denied disability. (Collaboration with CCM RN during this encounter)See Care Plan below for interventions and patient self-care actives.  Recommendation: Patient may benefit from, and is in agreement to allow LCSW to make referral to Voc Rehab to assist with supported employment or help with disability process.   Follow up Plan: Patient would like continued follow-up from CCM LCSW .  Follow up scheduled in three weeks 02/04/21. Patient will call office if needed prior to next encounter.   Review of patient past medical history, allergies, medications, and health status, including review of relevant consultants reports was performed today as part of a comprehensive  evaluation and provision of chronic care management and care coordination services.  SDOH (Social Determinants of Health) assessments and interventions performed:  SDOH Interventions    Flowsheet Row Most Recent Value  SDOH Interventions   Financial Strain Interventions [id number] Referral  Stress Interventions Provide Counseling  Transportation Interventions [facility]        Advanced Directives Status: Not addressed in this encounter.  Care Plan  Allergies  Allergen Reactions   Adhesive [Tape]    Diphenhydramine Hcl     throat swelling   Ibuprofen Swelling   Latex Rash    Outpatient Encounter Medications as of 01/14/2021  Medication Sig   ASPIRIN PO Take 1 tablet by mouth daily as needed (for chest pain).   clindamycin (CLINDAGEL) 1 % gel Apply topically 2 (two) times daily.   doxycycline (VIBRAMYCIN) 100 MG capsule Take 1 capsule (100 mg total) by mouth 2 (two) times daily.   No facility-administered encounter medications on file as of 01/14/2021.    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    Conditions to be addressed/monitored:  Financial constraints related to no income and stress  Care Plan : General Social Work (Adult)  Updates made by Soundra Pilon, LCSW since 01/14/2021 12:00 AM   Problem: Barriers to Treatment no insurance    Goal: Barriers to  Treatment  Managed by connecting with resources   Start Date: 01/14/2021  This Visit's Progress: On track  Priority: High  Current barriers:    Financial constraints related to no income and  Transportation Clinical Goals: Patient will work with resources discussed to address needs related to SDOH Clinical Interventions:  Collaboration with Derrel Nip, MD regarding development and update of comprehensive plan of care as evidenced by provider attestation and co-signature Inter-disciplinary care team collaboration (see longitudinal plan of care) Assessment of needs, barriers , agencies contacted, as well as how impacting Review various resources, discussed options and provided patient information about  [employer] (phone number provided) Various insurance options ( [employer], Haematologist ) [facility] front office to mail application  Referral for [employer]( Vocational Rehabilitation) Solution-Focused Strategies, Emotional Supportive Provided, Problem Solving [facility] , and Participation in counseling encouraged  Patient Goals/Self-Care Activities: Over the next 21 days Call [employer] [phone] for [facility] related appointments You will receive an application in the mail for [employer], [employer] Assistance please return them I have places a referral  with [facility] for vocational rehabilitation ( please give them a call [phone])    Problem: Coping Skills enhanced    Goal: Coping Skills Enhanced via supportive therapies   Start Date: 01/14/2021  This Visit's Progress: On track  Priority: High  Current barriers:   Acute Mental Health needs related to stress No insurance Needs Support, Education, and Care Coordination in order to meet unmet mental health needs. Clinical Goal(s): patient will work with [facility] to address needs related to stress.  LCSW will provider brief interventions as needed until connected for ongoing therapy   Clinical Interventions:  Assessed patient's previous and current treatment, coping skills, support system and barriers to care  Review various resources, discussed options and provided  patient information about several options for mental health treatment based on need and insurance Solution-Focused Strategies, Active listening / Reflection utilized , Emotional Supportive Provided, Problem Solving /Task Center , Participation in support group encouraged , and referral via [facility] ; Collaborated with RN Case Manager re: CPAP ( patient declined) Discussed several options for long term counseling based on need and insurance.  Assisted patient with narrowing the options down to ([facility]  ) Collaboration with PCP regarding development and update of comprehensive plan of care as evidenced by provider attestation and co-signature Inter-disciplinary care team collaboration (see longitudinal plan of care) Patient Goals/Self-Care Activities: Over the next 30 days I have places a referral  with [facility] for counseling with  [facility]. They will call you to schedule a phone appointment.      Sammuel Hines, LCSW Care Management & Coordination  [facility]   [phone] 2:41 PM

## 2021-01-14 NOTE — Patient Instructions (Signed)
Licensed Clinical Social Worker Visit Information  Goals we discussed today:   Goals Addressed             This Visit's Progress    Begin and Stick with Counseling       Timeframe:  Short-Term Goal Priority:  High Start Date:   01/14/2021                          Expected End Date:                       Follow Up Date 02/04/21   I have places a referral  with [facility] for counseling with [facility]  Why is this important?   Beating depression may take some time.  If you don't feel better right away, don't give up on your treatment plan.      Find Help in My Community       Timeframe:  Short-Term Goal Priority:  High Start Date: 01/14/2021                            Expected End Date:                       Follow Up Date 02/05/21    Patient Goals/Self-Care Activities: Over the next 21 days Call [facility] [phone] for [facility] related appointments You will receive an application in the mail for [employer], [employer] please return them I have places a referral  with [facility] for vocational rehabilitation ( please give them a call [phone])   Why is this important?   Knowing how and where to find help for yourself or family in your neighborhood and community is an important skill.         Ms. Round was given information about Care Management services today including:  Care Management services include personalized support from designated clinical staff supervised by her physician, including individualized plan of care and coordination with other care providers 24/7 contact phone numbers for assistance for urgent and routine care needs. The patient may stop Care Management services at any time by phone call to the office staff.  Patient agreed to services and verbal consent obtained.   Patient verbalizes understanding of instructions provided today.   Follow up plan: SW will follow up with patient by phone over the next 3  weeks   Sammuel Hines, [facility] Care Management & Coordination  [phone]

## 2021-02-04 ENCOUNTER — Ambulatory Visit: Payer: Self-pay | Admitting: Licensed Clinical Social Worker

## 2021-02-04 ENCOUNTER — Telehealth: Payer: Self-pay

## 2021-02-04 DIAGNOSIS — Z7189 Other specified counseling: Secondary | ICD-10-CM

## 2021-02-04 DIAGNOSIS — F439 Reaction to severe stress, unspecified: Secondary | ICD-10-CM

## 2021-02-04 NOTE — Patient Instructions (Addendum)
Visit Information   Goals Addressed             This Visit's Progress    Begin and Stick with Counseling   On track    Timeframe:  Short-Term Goal Priority:  High Start Date:   01/14/2021                          Expected End Date:                       Follow Up Date 02/04/21  I have places a referral with [facility] for counseling. They will call you to schedule an appointment.  Why is this important?   Beating depression may take some time.  If you don't feel better right away, don't give up on your treatment plan.       Find Help in My Community   On track    Timeframe:  Short-Term Goal Priority:  High Start Date: 01/14/2021                            Expected End Date:                       Follow Up Date 02/05/21    Patient Goals/Self-Care Activities: Over the next 21 days  Call [facility] [phone] for [facility] related appointments Complete the vocational rehabilitation application as soon as you receive it  Why is this important?   Knowing how and where to find help for yourself or family in your neighborhood and community is an important skill.         It was a pleasure speaking with you today. per your request your appointment is scheduled 03/04/2021 Please call the office if needed Patient verbalizes understanding of instructions provided today.   Sammuel Hines, LCSW Care Management & Coordination  [phone]

## 2021-02-04 NOTE — Chronic Care Management (AMB) (Signed)
Care Management Clinical Social Work Note  02/04/2021 Name: Tiffany Allen MRN: [id number] DOB: 04-15-1986  Tiffany Allen is a 35 y.o. year old female who is a primary care patient of Derrel Nip, MD.  The Care Management team was consulted for assistance with  coordination needs.  Consent to Services:  The patient was given information about Care Management services, agreed to services, and gave verbal consent prior to initiation of services.  Please see initial visit note for detailed documentation.   Patient agreed to services today and consent obtained.   Assessment: Engaged with patient by phone in response to provider referral for social work care coordination services: [facility]  and [employer] [facility].    She is making progress with connecting with community support ( [employer], [employer]) . She continues to experience difficulty with connecting for counseling.  After speaking with [facility] decides this was not a good fit for her needs.  See Care Plan below for interventions and patient self-care actives.  Recommendation: Patient may benefit from, and is in agreement to allow LCSW to make a referral to [facility].   Follow up Plan: Patient would like continued follow-up from CCM LCSW .  per patient's request will follow up in 30 days.  Will call office if needed prior to next encounter.   Review of patient past medical history, allergies, medications, and health status, including review of relevant consultants reports was performed today as part of a comprehensive evaluation and provision of chronic care management and care coordination services.  SDOH (Social Determinants of Health) assessments and interventions performed:    Advanced Directives Status: Not addressed in this encounter.  Care Plan  Allergies  Allergen Reactions   Adhesive [Tape]    Diphenhydramine Hcl     throat  swelling   Ibuprofen Swelling   Latex Rash    Outpatient Encounter Medications as of 02/04/2021  Medication Sig   ASPIRIN PO Take 1 tablet by mouth daily as needed (for chest pain).   clindamycin (CLINDAGEL) 1 % gel Apply topically 2 (two) times daily.   doxycycline (VIBRAMYCIN) 100 MG capsule Take 1 capsule (100 mg total) by mouth 2 (two) times daily.   No facility-administered encounter medications on file as of 02/04/2021.    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    Conditions to be addressed/monitored: Depression; Financial constraints  Care Plan : General Social Work (Adult)  Updates made by Soundra Pilon, LCSW since 02/04/2021 12:00 AM     Problem: Barriers to Treatment no insurance      Goal: Barriers to Treatment  Managed by connecting with resources   Start Date: 01/14/2021  This Visit's Progress: On track  Recent Progress: On track  Priority: High  Note:   Current barriers:    Financial constraints related to no income and Transportation Clinical Goals: Patient will work with resources discussed to address needs related to SDOH Clinical Interventions:  Inter-disciplinary care team collaboration (see longitudinal plan of care) Assessment of needs, barriers , agencies contacted and progress [employer], [employer]  Financial Assistance completed and mailed  Vocational Rehabilitation( spoke with case worker  Narda Bonds waiting on application in the mail) Solution-Focused Strategies, Emotional Supportive Provided, Problem Solving /Task Center , and Participation in  counseling encouraged  Patient Goals/Self-Care Activities: Over the next 21 days Call [facility] [phone] for [facility] related appointments Complete the vocational rehabilitation application as soon as you receive it    Problem: Coping Skills enhanced      Goal: Coping Skills Enhanced via supportive therapies   Start Date: 01/14/2021  This Visit's Progress: On track  Recent Progress: On track  Priority: High  Note:   Current barriers:   Acute Mental Health needs related to stress No insurance Needs Support, Education, and Care Coordination in order to meet unmet mental health needs. Clinical Goal(s): patient will work with [facility] to address needs related to stress.   Clinical Interventions:  Assessed patient's coping skills, support system, progress and barriers to care  Review various resources, discussed options and provided patient information about several options for mental health treatment based on need and insurance Solution-Focused Strategies, Active listening / Reflection utilized , Emotional Supportive Provided, Problem Solving /Task Center , Suicidal Ideation/Homicidal Ideation assessed:, and referral via [facility] ; Discussed several options for long term counseling based on need and insurance.  Assisted patient with narrowing the options down to [facility] ) referral placed Inter-disciplinary care team collaboration (see longitudinal plan of care) Patient Goals/Self-Care Activities: Over the next 30 days I have places a referral with [facility] for counseling. They will call you to schedule an appointment.      Sammuel Hines, LCSW Care Management & Coordination  [facility] / [facility]   [phone] 1:55 PM

## 2021-03-04 ENCOUNTER — Ambulatory Visit: Payer: Self-pay | Admitting: Licensed Clinical Social Worker

## 2021-03-04 DIAGNOSIS — F439 Reaction to severe stress, unspecified: Secondary | ICD-10-CM

## 2021-03-04 DIAGNOSIS — Z7189 Other specified counseling: Secondary | ICD-10-CM

## 2021-03-04 NOTE — Patient Instructions (Signed)
Visit Information   Goals Addressed             This Visit's Progress    Find Help in My Community       Timeframe:  Short-Term Goal Priority:  High Start Date: 01/14/2021                            Expected End Date:                       Follow Up Date 02/05/21    Patient Goals/Self-Care Activities: Over the next 21 days  Call [facility] [phone] for [facility] related appointments Follow up on vocational rehabilitation and [employer] application     manage emotions by connecting with Counseling       Timeframe:  Short-Term Goal Priority:  High Start Date:   01/14/2021                          Expected End Date:                       Follow Up Date 02/04/21  [facility] has not been able to reach you to schedule an appointment. Please walk in for therapy on Fridays 1-4 or call to make an appointment [phone]       It was a pleasure speaking with you today. Please call the office if needed No follow up scheduled, per our conversation I will contact you in 30 days.  Patient verbalizes understanding of instructions provided today.   Sammuel Hines, LCSW Care Management & Coordination  [phone]

## 2021-03-04 NOTE — Chronic Care Management (AMB) (Signed)
Care Management Clinical Social Work Note  03/04/2021 Name: Tiffany Allen MRN: [id number] DOB: 1985/09/08  Tiffany Allen is a 35 y.o. year old female who is a primary care patient of Derrel Nip, MD.  The Care Management team was consulted for assistance with chronic disease management and coordination needs.  Consent to Services:  The patient was given information about Care Management services, agreed to services, and gave verbal consent prior to initiation of services.  Please see initial visit note for detailed documentation.   Patient agreed to services today and consent obtained.   Assessment: Engaged with Patient by phone in response to provider referral for social work care coordination services: [facility]  and [employer].    She  is making progress with connecting to community support for the [employer] and [employer]. Patient has not connected for counseling.  She is making progress with managing her emotions by painting and daily meditation . See Care Plan below for interventions and patient self-care actives.  Recommendation: Patient may benefit from, and is in agreement to call [facility] to schedule an appointment.   Follow up Plan: Patient would like continued follow-up from CCM LCSW .  per patient's request will follow up in 30 days.  Will call office if needed prior to next encounter.    Review of patient past medical history, allergies, medications, and health status, including review of relevant consultants reports was performed today as part of a comprehensive evaluation and provision of chronic care management and care coordination services.  SDOH (Social Determinants of Health) assessments and interventions performed:    Advanced Directives Status: Not addressed in this encounter.  Care Plan  Allergies  Allergen Reactions   Adhesive [Tape]    Diphenhydramine Hcl     throat  swelling   Ibuprofen Swelling   Latex Rash    Outpatient Encounter Medications as of 03/04/2021  Medication Sig   ASPIRIN PO Take 1 tablet by mouth daily as needed (for chest pain).   clindamycin (CLINDAGEL) 1 % gel Apply topically 2 (two) times daily.   doxycycline (VIBRAMYCIN) 100 MG capsule Take 1 capsule (100 mg total) by mouth 2 (two) times daily.   No facility-administered encounter medications on file as of 03/04/2021.    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    Conditions to be addressed/monitored: Anxiety and Depression;   Care Plan : General Social Work (Adult)  Updates made by Soundra Pilon, LCSW since 03/04/2021 12:00 AM     Problem: Barriers to Treatment no insurance      Goal: Barriers to Treatment  Managed by connecting with resources   Start Date: 01/14/2021  This Visit's Progress: On track  Recent Progress: On track  Priority: High  Note:   Current barriers:    Waiting for applications to be processed for the [employer] and [employer] constraints related to no income and Transportation Clinical Goals: Patient will work with resources discussed to address needs related to SDOH Clinical Interventions:  Inter-disciplinary care team collaboration (see longitudinal plan of care) Assessment  of needs, barriers , agencies contacted and progress [employer], [employer] completed, mailed and waiting to be processed  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 mailed back to [employer] Solution-Focused  Strategies, Emotional Supportive Provided, Problem Solving /Task Center , and Participation in counseling encouraged  Patient Goals/Self-Care Activities: Over the next 21 days Call [employer] [phone] for [facility] related appointments Follow up on vocational rehabilitation and [employer] application    Problem: Coping Skills enhanced      Goal: Coping Skills Enhanced via supportive therapies   Start Date: 01/14/2021  This Visit's Progress: On track  Recent Progress: On track  Priority: High  Note:   Current barriers:   [facility] has not been able to reach patient Acute Mental Health needs related to stress No insurance Needs Support, Education, and Care Coordination in order to meet unmet mental health needs. Clinical Goal(s): patient will work with [facility] to address needs related to stress.   Clinical Interventions:  Assessed patient's coping skills, support system, progress and barriers to care  [facility] has not been able to reach patient to schedule an appointment. Review various resources, discussed options and provided patient information about several options for mental health treatment based on need and insurance Solution-Focused Strategies, Active listening / Reflection utilized , Emotional Supportive Provided, Problem Solving /Task Center , Suicidal Ideation/Homicidal Ideation assessed:, and referral via [facility] ; Inter-disciplinary care team collaboration (see longitudinal plan of care) Patient Goals/Self-Care Activities: Over the next 30 days [facility] has not been able to reach you to schedule an appointment. Please walk in for therapy on Fridays 1-4 or call to make an appointment [phone]      Sammuel Hines, LCSW Care Management & Coordination  [facility] / [facility]   [phone] 2:30 PM

## 2021-04-05 ENCOUNTER — Telehealth: Payer: Self-pay | Admitting: *Deleted

## 2021-04-05 NOTE — Chronic Care Management (AMB) (Signed)
  Care Management   Note  04/05/2021 Name: [name] MRN: [id number] DOB: 02/21/1986  Tiffany Allen is a 35 y.o. year old female who is a primary care patient of Derrel Nip, MD and is actively engaged with the care management team. I reached out to Tiffany Allen by phone today to assist with re-scheduling a follow up visit with the Licensed Clinical Social Worker  Follow up plan: Patient declines further follow up and engagement by the care management team. Appropriate care team members and provider have been notified via electronic communication. The care management team is available to follow up with the patient after provider conversation with the patient regarding recommendation for care management engagement and subsequent re-referral to the care management team. The patient has been provided with contact information for the care management team and has been advised to call with any health related questions or concerns.   Gwenevere Ghazi  Care Guide, Embedded Care Coordination [facility] Management  Direct Dial: [phone]

## 2021-04-17 ENCOUNTER — Ambulatory Visit: Payer: Self-pay | Admitting: Licensed Clinical Social Worker

## 2021-04-17 DIAGNOSIS — Z7189 Other specified counseling: Secondary | ICD-10-CM

## 2021-04-17 NOTE — Chronic Care Management (AMB) (Signed)
Care Management  Clinical Social Work Note  04/17/2021 Name: Tiffany Allen MRN: [id number] DOB: Sep 08, 1985  Tiffany Allen is a 35 y.o. year old female who is a primary care patient of Derrel Nip, MD. The CCM team was consulted for assistance with care coordination needs. [facility]  and connecting for counseling.    Consent to Services:  The patient was given information about Care Management services, agreed to services, and gave verbal consent prior to initiation of services.  Please see initial visit note for detailed documentation.   Patient agreed to services today and consent obtained.  Engaged with patient by telephone for follow up visit in response to provider referral for social work care coordination services.   Assessment/Interventions: Assessed patient's current treatment, progress, coping skills, support system and barriers to care.  She is making progress with connecting to community resources for support and managing her stress with painting actives . She has not connected with mental health provider at this time.See Care Plan below for interventions and patient self-care actives.  Recommendation: Patient may benefit from, and is in agreement to contact [facility] or [facility] of the [place].   Follow up Plan: Patient would like continued follow-up from CCM LCSW .  per patient's request will follow up in 30 days.  Will call office if needed prior to next encounter.   Review of patient past medical history, allergies, medications, and health status, including review of pertinent consultant reports was performed as part of comprehensive evaluation and provision of care management/care coordination services.   SDOH (Social Determinants of Health) screening and interventions performed today:   Advanced Directives Status:Not addressed in this encounter.     Care Plan    Conditions to be addressed/monitored per PCP order:  Stress ,    Care Plan : General Social Work (Adult)  Updates made by Soundra Pilon, LCSW since 04/17/2021 12:00 AM     Problem: Barriers to Treatment no insurance      Goal: Barriers to Treatment  Managed by connecting with resources   Start Date: 01/14/2021  This Visit's Progress: On track  Recent Progress: On track  Priority: High  Note:   Current barriers:   Needs an ID to process applications and to move forward with VR  Waiting for applications to be processed for the [employer] and [employer] constraints related to no income Clinical Goals: Patient will work with resources discussed to address needs related to SDOH Clinical Interventions:  Inter-disciplinary care team collaboration (see longitudinal plan of care) Assessment of needs, barriers , agencies contacted and progress [employer], [employer] completed, mailed and waiting to be processed  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 mailed back to [employer] Solution-Focused Strategies, Emotional Supportive Provided, Problem Solving /Task Center , and Participation in counseling encouraged  Patient Goals/Self-Care Activities: Over the next 30 days Call [employer] [phone] for [facility] related appointments Follow up on vocational rehabilitation and [employer] your photo ID      Problem: Coping Skills enhanced      Goal: Coping Skills Enhanced via supportive therapies   Start Date: 01/14/2021  This Visit's Progress: Not on track  Recent Progress: On track  Priority: High  Note:   Current barriers:   Patient has not called to schedule appointment [facility] has not been able to reach patient Acute Mental Health needs related to stress No insurance Needs Support,  Education, and Care Coordination in order to meet unmet mental health needs. Clinical Goal(s): patient will work with [facility] to  address needs related to stress.   Clinical Interventions:  Assessed patient's coping skills, support system, progress and barriers to care  [facility] has not been able to reach patient to schedule an appointment. Review various resources, discussed options and provided patient information about several options for mental health treatment based on need and insurance Solution-Focused Strategies, Active listening / Reflection utilized , Emotional Supportive Provided, Problem Solving /Task Center , Suicidal Ideation/Homicidal Ideation assessed:, and referral via [facility] ; Inter-disciplinary care team collaboration (see longitudinal plan of care) Patient Goals/Self-Care Activities: Over the next 30 days [facility] has not been able to reach you to schedule an appointment. Please walk in for therapy on Fridays 1-4 or call to make an appointment [phone] or [facility] of the [place] [phone] to schedule an appointment     Sammuel Hines, LCSW Care Management & Coordination  [facility] / [facility] [employer]   [phone]

## 2021-04-17 NOTE — Patient Instructions (Signed)
Licensed Clinical Social Worker Visit Information  Goals we discussed today:   Goals Addressed             This Visit's Progress    manage emotions by connecting with Counseling   Not on track    Timeframe:  Short-Term Goal Priority:  High Start Date:   01/14/2021                          Expected End Date:                       [facility] has not been able to reach you to schedule an appointment. Please walk in for therapy on Fridays 1-4 or call to make an appointment [phone] or [facility] of the [place] [phone] to schedule an appointment         Patient agreed to services and verbal consent obtained.   It was a pleasure speaking with you today. Please call the office if needed Follow up appointment is scheduled 05/16/2021 Patient's caregiver verbalized understanding of instructions, educational materials, and care plan provided today and declined offer to receive copy of patient instructions, educational materials, and care plan.  Sammuel Hines, LCSW Care Management & Coordination  [phone]

## 2021-05-16 ENCOUNTER — Ambulatory Visit: Payer: Self-pay | Admitting: Licensed Clinical Social Worker

## 2021-05-16 DIAGNOSIS — Z599 Problem related to housing and economic circumstances, unspecified: Secondary | ICD-10-CM

## 2021-05-16 DIAGNOSIS — F439 Reaction to severe stress, unspecified: Secondary | ICD-10-CM

## 2021-05-16 NOTE — Patient Instructions (Signed)
Visit Information  Thank you for taking time to visit with me today. Please don't hesitate to contact me if I can be of assistance to you before our next scheduled telephone appointment.  Following are the goals we discussed today: Connecting for Counseling and community resource support Patient Self-Care Activities:  Call [employer] [phone] for [facility] related appointments Follow up on vocational rehabilitation and [employer] your photo ID  [facility] has not been able to reach you to schedule an appointment. Please walk in for therapy on Fridays 1-4 or call to make an appointment [phone] or [facility] of the [place] [phone] to schedule an appointment   Please call the care guide team at [phone] if you need to cancel or reschedule your appointment.   If you are experiencing a Mental Health or Behavioral Health Crisis or need someone to talk to, please call the Suicide and Crisis Lifeline: 988 call the [place] National Suicide Prevention Lifeline: 346-242-6528 or TTY: (228)781-3063 TTY (949)130-6759) to talk to a trained counselor call 1-800-273-TALK (toll free, 24 hour hotline) go to [facility] [address], [place] [phone]) call 911   Please call the office if needed No follow up scheduled, per our conversation you do not require or desire continued follow up Patient verbalized understanding of instructions, educational materials, and care plan provided today and declined offer to receive copy of patient instructions, educational materials, and care plan.  Sammuel Hines, LCSW Care Management & Coordination  [phone]

## 2021-05-16 NOTE — Chronic Care Management (AMB) (Signed)
Care Management  Clinical Social Work Note  05/16/2021 Name: Tiffany Allen MRN: [id number] DOB: [birth year]  Tiffany Allen is a 35 y.o. year old female who is a primary care patient of Derrel Nip, MD. The CCM team was consulted for assistance with care coordination needs. [facility]  and connect for counseling.    Consent to Services:  The patient was given information about Care Management services, agreed to services, and gave verbal consent prior to initiation of services.  Please see initial visit note for detailed documentation.   Patient agreed to services today and consent obtained.  Engaged with patient by telephone for follow up visit in response to provider referral for social work care coordination services. .   Assessment: Patient has not made progress with completing task related to care plan goals. Reports she knows what she needs to do and has the information, she just needs to do it.  Review of patient past medical history, allergies, medications, and health status, including review of pertinent consultant reports was performed as part of comprehensive evaluation and provision of care management/care coordination services.  See Care Plan below for interventions and patient self-care actives.   SDOH (Social Determinants of Health) screening and interventions performed today:   Advanced Directives Status:Not addressed in this encounter.     Care Plan    Conditions to be addressed/monitored per PCP order:  stress , Financial constraints related to no income.  Care Plan : General Social Work (Adult)  Updates made by Soundra Pilon, LCSW since 05/16/2021 12:00 AM     Problem: Barriers to Treatment no insurance      Goal: Barriers to Treatment  Managed by connecting with resources   Start Date: 01/14/2021  This Visit's Progress: Not on track  Recent Progress: On track  Priority: High  Note:   Current barriers:   Needs an ID to process applications  and to move forward with [facility]  Waiting for applications to be processed for the [employer] and [employer] constraints related to no income Clinical Goals: Patient will work with resources discussed to address needs related to SDOH Clinical Interventions:  Inter-disciplinary care team collaboration (see longitudinal plan of care) Assessment of needs, barriers , agencies contacted and progress [employer], [employer] completed, mailed and waiting to be processed  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 mailed back to [employer] Solution-Focused Strategies, Emotional Supportive Provided, Problem Solving /Task Center , and Participation in counseling encouraged  Patient Goals/Self-Care Activities:  Call [employer] [phone] for [facility] related appointments Follow up on vocational rehabilitation and [employer] your photo ID      Problem: Coping Skills enhanced      Goal: Coping Skills Enhanced via supportive therapies   Start Date: 01/14/2021  Recent Progress: Not on track  Priority: High  Note:   Current barriers:   Patient has not called to schedule appointment [facility] has not been able to reach patient Acute Mental Health needs related to stress No insurance Needs Support, Education, and Care Coordination in order to meet unmet mental health needs. Clinical Goal(s): patient will work with [facility] to address needs related to stress.   Clinical Interventions:  Assessed patient's coping skills, support system, progress and barriers to care  [facility] has not been able to reach patient to schedule an appointment. Review various resources, discussed options and provided patient information about several  options for mental health treatment based on need and insurance Solution-Focused Strategies, Active listening / Reflection  utilized , Emotional Supportive Provided, Problem Solving /Task Center , Suicidal Ideation/Homicidal Ideation assessed:, and referral via [facility] ; Inter-disciplinary care team collaboration (see longitudinal plan of care) Patient Goals/Self-Care Activities:  [facility] has not been able to reach you to schedule an appointment. Please walk in for therapy on Fridays 1-4 or call to make an appointment [phone] or [facility] of the [place] [phone] to schedule an appointment     Follow up Plan:  Patient does not require or desire continued follow-up by CCM LCSW. Will contact the office if needed Patient may benefit from and is in agreement for CCM LCSW to remain part of care team for the next 20 days.  If no needs are identified in the next 20 days, CCM LSCW will disconnect from the care team.  Sammuel Hines, LCSW Care Management & Coordination  [facility] / [employer]   [phone]

## 2022-03-10 ENCOUNTER — Ambulatory Visit (INDEPENDENT_AMBULATORY_CARE_PROVIDER_SITE_OTHER): Payer: Self-pay | Admitting: Family Medicine

## 2022-03-10 ENCOUNTER — Encounter: Payer: Self-pay | Admitting: Family Medicine

## 2022-03-10 VITALS — BP 139/110 | HR 107 | Ht 62.0 in | Wt 297.2 lb

## 2022-03-10 DIAGNOSIS — L732 Hidradenitis suppurativa: Secondary | ICD-10-CM

## 2022-03-10 DIAGNOSIS — N6320 Unspecified lump in the left breast, unspecified quadrant: Secondary | ICD-10-CM

## 2022-03-10 MED ORDER — CLINDAMYCIN PHOSPHATE 1 % EX GEL: Freq: Two times a day (BID) | CUTANEOUS | 0 refills | Status: AC

## 2022-03-10 MED ORDER — DOXYCYCLINE HYCLATE 100 MG PO TABS: 100.0000 mg | ORAL_TABLET | Freq: Two times a day (BID) | ORAL | 0 refills | Status: AC

## 2022-03-10 NOTE — Progress Notes (Signed)
    SUBJECTIVE:   CHIEF COMPLAINT / HPI:   Hidradenitis suppurativa Had since 17. Consistently has painful and bothersome boils all around body. Usually uses heating pad to help the boils drain out. Has had others drained 2 times. Has also taken some antibiotics in the past without much help. Has never seen a specialist for this. Does not take anything for pain.  Lump on breast First noticed 3 months ago. Thought it was a white head but has not been able to get anything out of it. Has grown since this time. No drainage or bleeding. No other symptoms in the past. Thinks she may have had a mammogram before but unsure of results. Family history of breast cancer - both grandmothers.   PERTINENT  PMH / PSH: HS, HTN, PCOS  OBJECTIVE:   BP (!) 139/110   Pulse (!) 107   Ht 5\' 2"  (1.575 m)   Wt 297 lb 3.2 oz (134.8 kg)   LMP 01/31/2022   SpO2 98%   BMI 54.36 kg/m   General: Alert and oriented, in NAD Skin: Warm, dry. Multiple nodules under breasts, axillae, and abdomen bilaterally. Ocassional sinus tracts observed throughout. Larger lesion on R breast with pus easily expressed and pain to palpation. Small, 1cm pearly nodule on L nipple without erythema or drainage. HEENT: NCAT, EOM grossly normal, midline nasal septum Respiratory: Breathing and speaking comfortably on RA Extremities: Moves all extremities grossly equally Neurological: No gross focal deficit Psychiatric: Appropriate mood and affect   Lesions under R side of abdomen  ASSESSMENT/PLAN:   Hidradenitis suppurativa Chronic. Symptoms and exam consistent with Charlestine Massed Stage II HS given some sinus tract formation. Will send in 10 days PO doxy and 14 days topical clindamycin for treatment of affected areas. Advised patient to follow up in 2 weeks to assess improvement, though unlikely to fully resolve. If continued symptoms, will consider surgery referral for further drainage vs dermatology referral for ongoing management.  Mass of  left breast Mass within L nipple. Appearance is cystic, see media tab for photo. Reassured there is no pain, bleeding, or drainage. Less concerned for breast cancer at this time, though considered Paget disease of the breast. However, given growth over time and family history of breast cancer, will refer for diagnostic mammogram and breast ultrasound. Will follow up results.    Need for physical Patient is due for physical. Given problems today and time constraints, will discuss elevated BP, anxiety, and other concerns at further visits. Patient is amenable to this plan.  Ethelene Hal, MD [place]

## 2022-03-10 NOTE — Patient Instructions (Signed)
It was great to see you today! Here's what we talked about:  I am giving you doxycycline for 10 days and topical metronidazole for 14 days. I want you to make an appointment in 2 weeks to follow up. If you are still having symptoms at this time, you may need surgery referral for drainage of these lesions. I do not think the area on your left breast is worrisome. It appears like a cyst. However, given your family history, I will order a diagnostic mammogram and ultrasound to evaluate further. We will follow up about your blood pressure and anxiety at your next visit.  Please let me know if you have any other questions.  Dr. Marcha Dutton

## 2022-03-10 NOTE — Assessment & Plan Note (Addendum)
Chronic. Symptoms and exam consistent with Charlestine Massed Stage II HS given some sinus tract formation. Will send in 10 days PO doxy and 14 days topical clindamycin for treatment of affected areas. Advised patient to follow up in 2 weeks to assess improvement, though unlikely to fully resolve. If continued symptoms, will consider surgery referral for further drainage vs dermatology referral for ongoing management.

## 2022-03-10 NOTE — Assessment & Plan Note (Signed)
Mass within L nipple. Appearance is cystic, see media tab for photo. Reassured there is no pain, bleeding, or drainage. Less concerned for breast cancer at this time, though considered Paget disease of the breast. However, given growth over time and family history of breast cancer, will refer for diagnostic mammogram and breast ultrasound. Will follow up results.

## 2022-03-28 ENCOUNTER — Ambulatory Visit: Payer: Self-pay | Admitting: Family Medicine

## 2022-04-03 ENCOUNTER — Encounter: Payer: Self-pay | Admitting: Family Medicine

## 2022-04-03 ENCOUNTER — Ambulatory Visit (INDEPENDENT_AMBULATORY_CARE_PROVIDER_SITE_OTHER): Payer: Self-pay | Admitting: Family Medicine

## 2022-04-03 VITALS — BP 126/96 | HR 86 | Ht 62.0 in | Wt 296.8 lb

## 2022-04-03 DIAGNOSIS — F339 Major depressive disorder, recurrent, unspecified: Secondary | ICD-10-CM | POA: Insufficient documentation

## 2022-04-03 DIAGNOSIS — R4589 Other symptoms and signs involving emotional state: Secondary | ICD-10-CM

## 2022-04-03 DIAGNOSIS — F39 Unspecified mood [affective] disorder: Secondary | ICD-10-CM | POA: Insufficient documentation

## 2022-04-03 DIAGNOSIS — L732 Hidradenitis suppurativa: Secondary | ICD-10-CM

## 2022-04-03 DIAGNOSIS — I10 Essential (primary) hypertension: Secondary | ICD-10-CM

## 2022-04-03 MED ORDER — METFORMIN HCL ER 500 MG PO TB24: 500.0000 mg | ORAL_TABLET | Freq: Every day | ORAL | 1 refills | Status: DC

## 2022-04-03 NOTE — Patient Instructions (Signed)
It was great to see you today! Here's what we talked about:  I have given you metformin for your HS. This is a fairly inexpensive medication that could help your symptoms. Please follow up in 1 month to see how you are doing. Let's focus on lifestyle changes to lower your blood pressure for now. See attached paperwork.   Therapy and Counseling Resources  [facility]  [place], [place] Crisis [phone]  [facility] [place],  ([place])   [place], [place]: [phone]) 8:30 - 12; 1 - 2:30  [facility] of the [place],  [place], [place]    ([phone]):8:30 - 12; 2 - 3PM  [facility],  [address],  [place]; [phone]):   Mon - Fri 8 AM - 5 PM  Specific Provider options Psychology Today  [email or web address] click on find a therapist  enter your zip code left side and select or tailor a therapist for your specific need.   24- Hour Availability:   [facility]  [address] [place], [place] Crisis [phone]  [facility] of the [employer] [place]  [phone]   [place]  [phone] (after hours)  Therapeutic Alternative/Mobile Crisis   [phone]  [place] National Suicide Hotline  [phone] Diamantina Monks)  Call 911 or go to emergency room  [facility]  [phone]);  [place] and [employer]  [phone]); [place], [place], [place], [place], [place], [place], [place]   Please let me know if you have any other questions.  Dr. Marcha Dutton

## 2022-04-03 NOTE — Assessment & Plan Note (Signed)
Not improved after course of antibiotics.  Given that she has failed initial therapy twice, will initiate alternative therapies.  Metformin appears to have some efficacy in HS; will initiate 500 mg daily. Given she does not have insurance, I believe this would be a good option to start with.  However, if her symptoms worsen or continue to be very bothersome, would need a dermatology referral.  I just feel it would be very expensive for her to pay for the specialty visit, and it would be difficult for her to obtain any biologics for chronic control. Will follow up any improvement in 1 month.

## 2022-04-03 NOTE — Assessment & Plan Note (Signed)
PHQ-9 today positive for a score of 1 on question #9.  Patient does endorse a history of self-harm most recently 6 years ago by trying to take pills.  She also endorses having passive thoughts of suicide most recently 4 months ago.  She does not have any active or passive suicidal ideation today.  Given she does not have insurance, gave resources for therapy and counseling for those that are uninsured.  Will follow-up in 1 month.

## 2022-04-03 NOTE — Progress Notes (Signed)
    SUBJECTIVE:   CHIEF COMPLAINT / HPI:   Hidradenitis suppurativa Patient has completed her course of topical and oral antibiotics without much improvement.  She is also on her menses at the moment, which she feels also exacerbates her HS. she has been on the oral topical antibiotics before with the same recurrence.  She is currently uninsured, and is concerned about paying for dermatology or surgical appointment.  She is amenable to trying other medications for her HS right now.  Elevated BP Her blood pressure has been persistently elevated at past office visits.  Endorses no symptoms.  She would not like to go on blood pressure medication at this time.  She does not like to take pills very often.  She is amenable to trying other interventions like lifestyle modifications.  PERTINENT  PMH / PSH: HS, HTN, PCOS  OBJECTIVE:   BP (!) 126/96   Pulse 86   Ht 5\' 2"  (1.575 m)   Wt 296 lb 12.8 oz (134.6 kg)   LMP 03/24/2022   SpO2 97%   BMI 54.29 kg/m   General: Alert and oriented, in NAD Skin: Warm, dry HEENT: NCAT, EOM grossly normal, midline nasal septum Cardiac: Regular rate Respiratory: Breathing and speaking comfortably on RA Abdominal: Nondistended Extremities: Moves all extremities grossly equally Neurological: No gross focal deficit Psychiatric: Appropriate mood and affect; GAD-7 = 9 and PHQ-9 positive #9 = 1  ASSESSMENT/PLAN:   Hidradenitis suppurativa Not improved after course of antibiotics.  Given that she has failed initial therapy twice, will initiate alternative therapies.  Metformin appears to have some efficacy in HS; will initiate 500 mg daily. Given she does not have insurance, I believe this would be a good option to start with.  However, if her symptoms worsen or continue to be very bothersome, would need a dermatology referral.  I just feel it would be very expensive for her to pay for the specialty visit, and it would be difficult for her to obtain any biologics  for chronic control. Will follow up any improvement in 1 month.  Essential hypertension BP persistently elevated today.  She is not interested in starting medications at this time.  Information on altering diet and exercise habits given.  DASH diet also explained and handout given.  Gave sample blood pressure reading log, and asked patient to record blood pressures BID until her next appointment in 1 month.  We will follow-up at that time and reassess need for medication.  Depressed mood PHQ-9 today positive for a score of 1 on question #9.  Patient does endorse a history of self-harm most recently 6 years ago by trying to take pills.  She also endorses having passive thoughts of suicide most recently 4 months ago.  She does not have any active or passive suicidal ideation today.  Given she does not have insurance, gave resources for therapy and counseling for those that are uninsured.  Will follow-up in 1 month.   Health maintenance HIV, HCV screening needed. Given PCOS, she should also have lipid panel and A1c rechecked given risk of metabolic syndrome. Will attempt to collect both at next visit.  Ethelene Hal, MD [place]

## 2022-04-03 NOTE — Assessment & Plan Note (Signed)
BP persistently elevated today.  She is not interested in starting medications at this time.  Information on altering diet and exercise habits given.  DASH diet also explained and handout given.  Gave sample blood pressure reading log, and asked patient to record blood pressures BID until her next appointment in 1 month.  We will follow-up at that time and reassess need for medication.

## 2022-04-10 ENCOUNTER — Ambulatory Visit: Payer: Self-pay

## 2022-04-10 ENCOUNTER — Ambulatory Visit
Admission: RE | Admit: 2022-04-10 | Discharge: 2022-04-10 | Disposition: A | Discharge status: Home / Self Care | Payer: No Typology Code available for payment source | Source: Ambulatory Visit | Attending: Family Medicine | Admitting: Family Medicine

## 2022-04-10 ENCOUNTER — Ambulatory Visit
Admission: RE | Admit: 2022-04-10 | Discharge: 2022-04-10 | Disposition: A | Discharge status: Home / Self Care | Payer: Self-pay | Source: Ambulatory Visit | Attending: Family Medicine | Admitting: Family Medicine

## 2022-04-10 DIAGNOSIS — N6320 Unspecified lump in the left breast, unspecified quadrant: Secondary | ICD-10-CM

## 2022-04-15 ENCOUNTER — Other Ambulatory Visit: Payer: Self-pay | Admitting: Family Medicine

## 2022-04-15 DIAGNOSIS — L732 Hidradenitis suppurativa: Secondary | ICD-10-CM

## 2022-04-21 ENCOUNTER — Ambulatory Visit: Payer: Self-pay | Admitting: Surgery

## 2022-05-02 ENCOUNTER — Ambulatory Visit: Payer: Self-pay | Admitting: Family Medicine

## 2022-05-22 ENCOUNTER — Ambulatory Visit (INDEPENDENT_AMBULATORY_CARE_PROVIDER_SITE_OTHER): Payer: Self-pay | Admitting: Family Medicine

## 2022-05-22 ENCOUNTER — Encounter: Payer: Self-pay | Admitting: Family Medicine

## 2022-05-22 VITALS — BP 149/109 | HR 88 | Ht 62.0 in | Wt 297.2 lb

## 2022-05-22 DIAGNOSIS — Z1159 Encounter for screening for other viral diseases: Secondary | ICD-10-CM

## 2022-05-22 DIAGNOSIS — L732 Hidradenitis suppurativa: Secondary | ICD-10-CM

## 2022-05-22 DIAGNOSIS — I10 Essential (primary) hypertension: Secondary | ICD-10-CM

## 2022-05-22 DIAGNOSIS — Z114 Encounter for screening for human immunodeficiency virus [HIV]: Secondary | ICD-10-CM

## 2022-05-22 DIAGNOSIS — F39 Unspecified mood [affective] disorder: Secondary | ICD-10-CM

## 2022-05-22 LAB — POCT GLYCOSYLATED HEMOGLOBIN (HGB A1C): Hemoglobin A1C: 5.5 % (ref 4.0–5.6)

## 2022-05-22 MED ORDER — AMLODIPINE BESYLATE 5 MG PO TABS: 5.0000 mg | ORAL_TABLET | Freq: Every day | ORAL | 0 refills | Status: DC

## 2022-05-22 MED ORDER — SERTRALINE HCL 50 MG PO TABS: 50.0000 mg | ORAL_TABLET | Freq: Every day | ORAL | 1 refills | Status: DC

## 2022-05-22 MED ORDER — METFORMIN HCL ER 500 MG PO TB24: 500.0000 mg | ORAL_TABLET | Freq: Every day | ORAL | 1 refills | Status: DC

## 2022-05-22 NOTE — Patient Instructions (Addendum)
It was great to see you today! Here's what we talked about:  I have refilled your metformin for HS. I have sent in Zoloft for mood. Let me know if you feel much more energized than usual or if you are not sleeping. Please also call the therapists on the sheet I gave you ASAP to get in with them. I have sent in Amlodipine for blood pressure. Come back in 1 month for a recheck.  Please let me know if you have any other questions.  Dr. Phineas Real

## 2022-05-22 NOTE — Assessment & Plan Note (Addendum)
Not at goal. Starting amlodipine 5 mg daily today. Will also obtain BMP to ensure no renal dysfunction causing HTN.

## 2022-05-22 NOTE — Assessment & Plan Note (Addendum)
No flares while on therapy. Given improvement, will send refills. Follow up as needed.

## 2022-05-22 NOTE — Assessment & Plan Note (Signed)
More anxiety symptoms now with associated GI upset. PHQ-9 6 with #9 negative. Given symptoms are causing dysfunction in daily functioning, recommended strongly following with therapy resources provided at last visit. Will also start zoloft 50 mg daily for mood. Follow up in 1 month for check in.

## 2022-05-22 NOTE — Progress Notes (Signed)
    SUBJECTIVE:   CHIEF COMPLAINT / HPI:   HS follow up Taking metformin as prescribed. Has not had a flare while on this medication. Has been out of it recently, and she felt like she began to flare again. Would like a refill given improvement.  Elevated BP BP has remained elevated, and she is aware of this. She continues to smoke tobacco and feels this is contributing to her elevated BP. She has tried lifestyle modifications, though her mood disorder has been confounding improvement. She is amenable to beginning medication therapy today.  Mood disorder Over last year, had more anxious symptoms. Started to experience more symptoms when leaving home. Also has more anxiety when riding in the car or going places. At that time, she feels dizzy, feels nauseous, and feels like she has to have a bowel movement. She tries to close her eyes, take deep breaths which help. She does not feel she has difficulty controlling the worrying. She now brings diapers and bags with her in case her GI symptoms worsen with anxiety while out. She does not have the GI symptoms otherwise throughout the day--only when she is anxious. She has not been connected with therapy resources since last visit.  OBJECTIVE:   BP (!) 149/109   Pulse 88   Ht 5\' 2"  (1.575 m)   Wt 297 lb 4 oz (134.8 kg)   LMP 05/17/2022   SpO2 98%   BMI 54.37 kg/m   General: Alert and oriented, in NAD HEENT: NCAT, EOM grossly normal, midline nasal septum Cardiac: Regular rate Respiratory: Breathing and speaking comfortably on RA Extremities: Moves all extremities grossly equally Neurological: No gross focal deficit Psychiatric: Appropriate mood and affect  ASSESSMENT/PLAN:   Essential hypertension Not at goal. Starting amlodipine 5 mg daily today. Will also obtain BMP to ensure no renal dysfunction causing HTN.  Mood disorder (HCC) More anxiety symptoms now with associated GI upset. PHQ-9 6 with #9 negative. Given symptoms are causing  dysfunction in daily functioning, recommended strongly following with therapy resources provided at last visit. Will also start zoloft 50 mg daily for mood. Follow up in 1 month for check in.  Hidradenitis suppurativa No flares while on therapy. Given improvement, will send refills. Follow up as needed.   Health maintenance Obtained HIV and HCV screening today.  14/07/2021, MD [facility]

## 2022-05-23 LAB — BASIC METABOLIC PANEL
BUN/Creatinine Ratio: 14 (ref 9–23)
BUN: 14 mg/dL (ref 6–20)
CO2: 19 mmol/L — ABNORMAL LOW (ref 20–29)
Calcium: 9.5 mg/dL (ref 8.7–10.2)
Chloride: 104 mmol/L (ref 96–106)
Creatinine, Ser: 0.97 mg/dL (ref 0.57–1.00)
Glucose: 85 mg/dL (ref 70–99)
Potassium: 4.4 mmol/L (ref 3.5–5.2)
Sodium: 137 mmol/L (ref 134–144)
eGFR: 77 mL/min/{1.73_m2} (ref >59)

## 2022-05-23 LAB — HCV INTERPRETATION

## 2022-05-23 LAB — HIV ANTIBODY (ROUTINE TESTING W REFLEX): HIV Screen 4th Generation wRfx: NONREACTIVE

## 2022-05-23 LAB — HCV AB W REFLEX TO QUANT PCR: HCV Ab: NONREACTIVE

## 2022-06-11 ENCOUNTER — Telehealth: Payer: Self-pay

## 2022-06-11 NOTE — Telephone Encounter (Signed)
Drafted letter stating patient's diagnoses of elevated BP and mood disorder. Also attached AVS to the letter with info on therapy resources.

## 2022-06-11 NOTE — Telephone Encounter (Signed)
Patient calls nurse line requesting a supportive letter for social services.   She reports she needs the letter to state the diagnoses of blood pressure and mood disorder.   She reports she is trying to get disability and reports she needs to turn this in by the end of the month.   She also asks I leave a copy of therapy resources up front for her. She reports she lost the original one given at recent apt.   Please let me know when letter is written and I will place everything up front for pick up.

## 2022-06-12 NOTE — Telephone Encounter (Signed)
Patient contacted and advised of letter and therapy resources ready for pick up.

## 2022-06-20 ENCOUNTER — Encounter: Payer: Self-pay | Admitting: Family Medicine

## 2022-06-20 ENCOUNTER — Ambulatory Visit (INDEPENDENT_AMBULATORY_CARE_PROVIDER_SITE_OTHER): Payer: Self-pay | Admitting: Family Medicine

## 2022-06-20 VITALS — BP 124/82 | HR 88 | Ht 62.0 in | Wt 297.1 lb

## 2022-06-20 DIAGNOSIS — F39 Unspecified mood [affective] disorder: Secondary | ICD-10-CM

## 2022-06-20 DIAGNOSIS — L732 Hidradenitis suppurativa: Secondary | ICD-10-CM

## 2022-06-20 DIAGNOSIS — I10 Essential (primary) hypertension: Secondary | ICD-10-CM

## 2022-06-20 NOTE — Patient Instructions (Signed)
It was great to see you today! Here's what we talked about:  Your blood pressure on recheck looked great! We will stick with the amlodipine 5 mg daily. Call your therapy resources and get connected ASAP! They will be a great resource! Continue taking your metformin for HS. I am so happy to hear it has helped. Come back in 1 month to make sure your blood pressure is still doing well and to make sure you have gotten in with therapy.  Please let me know if you have any other questions.  Dr. Marcha Dutton

## 2022-06-20 NOTE — Progress Notes (Addendum)
    SUBJECTIVE:   CHIEF COMPLAINT / HPI:   Mood follow up Tried zoloft once. After 30 minutes, she felt very agitated and full of energy. This feeling lasted for 3 days. However, she was able to sleep, and she sometimes felt like there were "rocks on her eyelids" that kept her asleep. She just overall did not feel well after taking the pill, so she did not continue. She has felt as if her mood has been better though since her last visit, as she has not had any lows. She denies SI/HI today. She has not called the therapy resources given the holidays, but she plans to call them soon.  Elevated BP She has been taking the amlodipine as prescribed. She has not had any side effects and has tolerated it overall well.  HS Continues to take the metformin, and it continues to stave off HS flares.  OBJECTIVE:   BP 124/82   Pulse 88   Ht 5\' 2"  (1.575 m)   Wt 134.8 kg   LMP 05/17/2022   SpO2 97%   BMI 54.34 kg/m   General: Alert and oriented, in NAD Skin: Warm, dry, and intact without lesions HEENT: NCAT, EOM grossly normal, midline nasal septum Cardiac: RRR, no m/r/g appreciated Respiratory: CTAB, breathing and speaking comfortably on RA Abdominal: Nondistended Extremities: Moves all extremities grossly equally Neurological: No gross focal deficit Psychiatric: Appropriate mood and affect, PHQ-9 score 7  ASSESSMENT/PLAN:   Essential hypertension BP much improved on amodipine 5 mg daily without side effects. Will continue current management.  Hidradenitis suppurativa Continues on metformin with good relief.  Mood disorder ([place]) Improved without medication. Offered additional medications with alternate MOA though patient would like to continue with therapy resources only at this time. Will follow up in 1 month to assess mood and therapy status.  Healthcare maintenance Declines Tdap vaccine. She had her last pap smear at the health department, but she is unsure of when. She will obtain  records and send to [place] with possibility of pap at next visit.   Ethelene Hal, MD [place]

## 2022-06-20 NOTE — Assessment & Plan Note (Signed)
Continues on metformin with good relief.

## 2022-06-20 NOTE — Assessment & Plan Note (Signed)
BP much improved on amodipine 5 mg daily without side effects. Will continue current management.

## 2022-06-20 NOTE — Assessment & Plan Note (Signed)
Improved without medication. Offered additional medications with alternate MOA though patient would like to continue with therapy resources only at this time. Will follow up in 1 month to assess mood and therapy status.

## 2022-07-17 ENCOUNTER — Other Ambulatory Visit: Payer: Self-pay | Admitting: *Deleted

## 2022-07-17 MED ORDER — METFORMIN HCL ER 500 MG PO TB24: 500.0000 mg | ORAL_TABLET | Freq: Every day | ORAL | 1 refills | Status: DC

## 2022-07-21 ENCOUNTER — Other Ambulatory Visit (HOSPITAL_COMMUNITY)
Admission: RE | Admit: 2022-07-21 | Discharge: 2022-07-21 | Disposition: A | Discharge status: Home / Self Care | Payer: Self-pay | Source: Ambulatory Visit | Attending: Family Medicine | Admitting: Family Medicine

## 2022-07-21 ENCOUNTER — Ambulatory Visit (INDEPENDENT_AMBULATORY_CARE_PROVIDER_SITE_OTHER): Payer: Self-pay | Admitting: Family Medicine

## 2022-07-21 ENCOUNTER — Encounter: Payer: Self-pay | Admitting: Family Medicine

## 2022-07-21 ENCOUNTER — Ambulatory Visit: Payer: Self-pay | Attending: Internal Medicine | Admitting: Internal Medicine

## 2022-07-21 ENCOUNTER — Ambulatory Visit: Payer: Self-pay | Admitting: Internal Medicine

## 2022-07-21 ENCOUNTER — Encounter: Payer: Self-pay | Admitting: Internal Medicine

## 2022-07-21 VITALS — BP 120/82 | HR 90 | Ht 62.0 in | Wt 296.8 lb

## 2022-07-21 VITALS — BP 132/88 | HR 100 | Ht 62.0 in | Wt 296.0 lb

## 2022-07-21 DIAGNOSIS — Z124 Encounter for screening for malignant neoplasm of cervix: Secondary | ICD-10-CM | POA: Insufficient documentation

## 2022-07-21 DIAGNOSIS — Z113 Encounter for screening for infections with a predominantly sexual mode of transmission: Secondary | ICD-10-CM

## 2022-07-21 DIAGNOSIS — Z720 Tobacco use: Secondary | ICD-10-CM

## 2022-07-21 DIAGNOSIS — F39 Unspecified mood [affective] disorder: Secondary | ICD-10-CM

## 2022-07-21 DIAGNOSIS — I10 Essential (primary) hypertension: Secondary | ICD-10-CM

## 2022-07-21 DIAGNOSIS — G4733 Obstructive sleep apnea (adult) (pediatric): Secondary | ICD-10-CM

## 2022-07-21 MED ORDER — BUPROPION HCL ER (XL) 150 MG PO TB24: 150.0000 mg | ORAL_TABLET | Freq: Every day | ORAL | 0 refills | Status: DC

## 2022-07-21 NOTE — Patient Instructions (Signed)
It was great to see you today! Here's what we talked about:  I have sent in wellbutrin 150 mg. Take this daily in the morning. Let me know if you feel very hyperactive again. This can also help to curb tobacco use. We completed your pap smear with STI testing today. I will follow up results.  Please let me know if you have any other questions.  Dr. Marcha Dutton

## 2022-07-21 NOTE — Patient Instructions (Signed)
Medication Instructions:  Your physician recommends that you continue on your current medications as directed. Please refer to the Current Medication list given to you today.  *If you need a refill on your cardiac medications before your next appointment, please call your pharmacy*   Lab Work: NONE If you have labs (blood work) drawn today and your tests are completely normal, you will receive your results only by: MyChart Message (if you have MyChart) OR A paper copy in the mail If you have any lab test that is abnormal or we need to change your treatment, we will call you to review the results.   Testing/Procedures: NONE   Follow-Up:As Needed At [facility], you and your health needs are our priority.  As part of our continuing mission to provide you with exceptional heart care, we have created designated Provider Care Teams.  These Care Teams include your primary Cardiologist (physician) and Advanced Practice Providers (APPs -  Physician Assistants and Nurse Practitioners) who all work together to provide you with the care you need, when you need it.  We recommend signing up for the patient portal called "MyChart".  Sign up information is provided on this After Visit Summary.  MyChart is used to connect with patients for Virtual Visits (Telemedicine).  Patients are able to view lab/test results, encounter notes, upcoming appointments, etc.  Non-urgent messages can be sent to your provider as well.   To learn more about what you can do with MyChart, go to [email or web address].      Provider:   Mahesh A Chandrasekhar, MD       

## 2022-07-21 NOTE — Assessment & Plan Note (Signed)
Continuing to work on feeling better.  She is following up with a [employer] for therapy resources, she is going to follow-up on getting Medicaid for further options.  Will also prescribe Wellbutrin 150 mg daily.  Advised patient to let me know if she has any side effects this medication.  I hope this will also help with her tobacco use.  Will follow-up in 1 month to assess improvement.

## 2022-07-21 NOTE — Progress Notes (Signed)
Cardiology Office Note:    Date:  07/21/2022   ID:  Tiffany Allen, DOB [birth year], MRN [id number]  PCP:  Jacelyn Grip, MD  [facility] Cardiologist:  Rudean Haskell MD  Referring MD: Jacelyn Grip, MD   CC: F/U CP  History of Present Illness:    Tiffany Allen is a 37 y.o. female with a hx of OSA +/- CPAP; Morbid Obesity, HTN, Tobacco Abuse who presents with after ED evaluation 04/11/20.  In interim of this visit, patient had negative stress test. 2024: Follow up.  Patient notes that she is doing well.   Since last visit notes that her blood pressure is improved . Hasn't seen Dr. Annamaria Boots for CPAP in a minute. There are no interval hospital/ED visit.   Still smoking.  No chest pain or pressure .  No SOB/DOE and no PND/Orthopnea.  No weight gain or leg swelling.  No palpitations or syncope.  She got up quickly in the lobby and fell over.   Past Medical History:  Diagnosis Date   Hypertension    does not take meds   Narcolepsy    OSA (obstructive sleep apnea)    RLS (restless legs syndrome)    No past surgical history on file.  Current Medications: Current Meds  Medication Sig   amLODipine (NORVASC) 5 MG tablet Take 1 tablet (5 mg total) by mouth at bedtime.   ASPIRIN PO Take 1 tablet by mouth daily as needed (for chest pain).   buPROPion (WELLBUTRIN XL) 150 MG 24 hr tablet Take 1 tablet (150 mg total) by mouth daily.   metFORMIN (GLUCOPHAGE-XR) 500 MG 24 hr tablet Take 1 tablet (500 mg total) by mouth daily with breakfast.   [DISCONTINUED] sertraline (ZOLOFT) 50 MG tablet Take 1 tablet (50 mg total) by mouth daily.    Allergies:   Adhesive [tape], Diphenhydramine hcl, Ibuprofen, and Latex   Social History   Socioeconomic History   Marital status: Single    Spouse name: Not on file   Number of children: Not on file   Years of education: Not on file   Highest education level: Not on file  Occupational History   Not on file  Tobacco Use   Smoking status:  Every Day    Packs/day: 0.50    Years: 8.00    Total pack years: 4.00    Types: Cigarettes   Smokeless tobacco: Never  Vaping Use   Vaping Use: Never used  Substance and Sexual Activity   Alcohol use: No   Drug use: No   Sexual activity: Yes  Other Topics Concern   Not on file  Social History Narrative   Not on file   Social Determinants of Health   Financial Resource Strain: Medium Risk (01/14/2021)   Overall Financial Resource Strain (CARDIA)    Difficulty of Paying Living Expenses: Somewhat hard  Food Insecurity: No Food Insecurity (01/14/2021)   Hunger Vital Sign    Worried About Running Out of Food in the Last Year: Never true    Ran Out of Food in the Last Year: Never true  Transportation Needs: No Transportation Needs (01/14/2021)   PRAPARE - Hydrologist (Medical): No    Lack of Transportation (Non-Medical): No  Physical Activity: Not on file  Stress: Stress Concern Present (01/14/2021)   [place]    Feeling of Stress : To some extent  Social Connections: Not on file  Social: Comes with Godmother  Family History: The patient's family history includes Atrial fibrillation in her mother; Diabetes in her paternal grandmother. Father died of heart attack age 15.  ROS:   Please see the history of present illness.    All other systems reviewed and are negative.  EKGs/Labs/Other Studies Reviewed:    The following studies were reviewed today:  EKG:   07/21/22: SR no ST-T changes 04/11/20 SR rate 98 without ST/T changes 11/26/19:  SR 83 lateral T wave flattening   NM Stress Testing : Date:08/23/20 Results: Nuclear stress EF: 66%. The left ventricular ejection fraction is hyperdynamic (>65%). There was no ST segment deviation noted during stress. No T wave inversion was noted during stress. The study is normal. This is a low risk study.   No evidence of ischemia or  infarction.   Recent Labs: 05/22/2022: BUN 14; Creatinine, Ser 0.97; Potassium 4.4; Sodium 137   Physical Exam:    VS:  BP 120/82   Pulse 90   Ht 5\' 2"  (1.575 m)   Wt 296 lb 12.8 oz (134.6 kg)   LMP 06/15/2022   SpO2 96%   BMI 54.29 kg/m     Wt Readings from Last 3 Encounters:  07/21/22 296 lb 12.8 oz (134.6 kg)  07/21/22 296 lb (134.3 kg)  06/20/22 297 lb 2 oz (134.8 kg)    GEN: Morbid Obesity well developed in no acute distress HEENT: Normal  NECK: No JVD CARDIAC: RRR, no murmurs, rubs, gallops RESPIRATORY:  Clear to auscultation without rales, wheezing or rhonchi  ABDOMEN: Soft, non-tender, non-distended MUSCULOSKELETAL:  No edema; No deformity  SKIN: Warm and dry NEUROLOGIC:  Alert and oriented x 3 PSYCHIATRIC:  Normal affect   ASSESSMENT:    1. Tobacco abuse   2. OBSTRUCTIVE SLEEP APNEA   3. Essential hypertension   4. Morbid obesity (HCC)     PLAN:    Tobacco Abuse - discussed the dangers of tobacco use, both inhaled and oral, which include, but are not limited to cardiovascular disease, increased cancer risk of multiple types of cancer, COPD, peripheral arterial disease, strokes. - counseled on the benefits of smoking cessation. - firmly advised to quit.  - reviewed psychological stressors and will start buproprion - we also reviewed strategies to maximize success, including: Removing cigarettes and smoking materials from environment  Stress management Substitution of other forms of reinforcement (the one cigarette a day approach) Support of family/friends and group smoking cessation Selecting a quit date Patient provided contact information for [employer] or 1-800-QUIT-NOW Patient provided with [place] 8 free smoking cessation classes: [phone] and [employer]    HTN - controled on norvasc  OSA - sees Dr. Annamaria Boots  Morbid Obesity - discussed lifestyle interventions   PRN f/u   Medication Adjustments/Labs and  Tests Ordered: Current medicines are reviewed at length with the patient today.  Concerns regarding medicines are outlined above.  No orders of the defined types were placed in this encounter.  No orders of the defined types were placed in this encounter.   There are no Patient Instructions on file for this visit.   Signed, Werner Lean, MD  07/21/2022 4:38 PM    [place]

## 2022-07-21 NOTE — Progress Notes (Signed)
    SUBJECTIVE:   CHIEF COMPLAINT / HPI:   Mood disorder Overall been well besides these last three days when she has felt more down.  No inciting event or anything in particular that has brought her down.  She just wanted to be more alone recently.  She has not had any thoughts of hurting herself.  She has contacted the, foundation who wants her to do a screening for a grant which would ultimately determine if she can get therapy there.  She is going to follow-up with them.  She is also amenable to trialing another medication now for her mood disorder.  Pap smear and STI testing Patient was unable to get records from health department since last visit to see when her last Pap was, though she believes it was more than 3 to 5 years ago.  She would like to have STI testing added onto this, as well as syphilis testing by blood today.  OBJECTIVE:   BP 132/88   Pulse 100   Ht 5\' 2"  (1.575 m)   Wt 296 lb (134.3 kg)   LMP 06/15/2022   SpO2 100%   BMI 54.14 kg/m   General: Alert and oriented, in NAD HEENT: NCAT, EOM grossly normal, midline nasal septum Respiratory: Breathing and speaking comfortably on RA Extremities: Moves all extremities grossly equally Neurological: No gross focal deficit Psychiatric: Mildly depressed mood though intermittently laughs GU: Normal-appearing labia, internal vaginal walls without erythema or lesions, scant thin white discharge at cervical os without evidence of lesions or bleeding  ASSESSMENT/PLAN:   Mood disorder ([place]) Continuing to work on feeling better.  She is following up with a [employer] for therapy resources, she is going to follow-up on getting Medicaid for further options.  Will also prescribe Wellbutrin 150 mg daily.  Advised patient to let me know if she has any side effects this medication.  I hope this will also help with her tobacco use.  Will follow-up in 1 month to assess improvement.   Health maintenance Pap smear with HPV  cotesting completed today. GC/chlamydia/trichomonas added on. RPR collected. Tobacco use discussed; patient is not ready to quit for now but is amenable to potential for wellbutrin to cut cravings.  Ethelene Hal, MD [place]

## 2022-07-22 LAB — RPR: RPR Ser Ql: NONREACTIVE

## 2022-07-23 LAB — CYTOLOGY - PAP
Adequacy: ABSENT
Chlamydia: NEGATIVE
Comment: NEGATIVE
Comment: NEGATIVE
Comment: NEGATIVE
Comment: NORMAL
Diagnosis: NEGATIVE
High risk HPV: NEGATIVE
Neisseria Gonorrhea: NEGATIVE
Trichomonas: NEGATIVE

## 2022-09-07 ENCOUNTER — Other Ambulatory Visit: Payer: Self-pay | Admitting: Family Medicine

## 2022-10-02 ENCOUNTER — Encounter: Payer: Self-pay | Admitting: Family Medicine

## 2022-10-02 ENCOUNTER — Ambulatory Visit (INDEPENDENT_AMBULATORY_CARE_PROVIDER_SITE_OTHER): Payer: Self-pay | Admitting: Family Medicine

## 2022-10-02 ENCOUNTER — Other Ambulatory Visit (HOSPITAL_COMMUNITY)
Admission: RE | Admit: 2022-10-02 | Discharge: 2022-10-02 | Disposition: A | Discharge status: Home / Self Care | Payer: Self-pay | Source: Ambulatory Visit | Attending: Family Medicine | Admitting: Family Medicine

## 2022-10-02 VITALS — BP 126/74 | HR 103 | Ht 62.0 in | Wt 293.2 lb

## 2022-10-02 DIAGNOSIS — I10 Essential (primary) hypertension: Secondary | ICD-10-CM

## 2022-10-02 DIAGNOSIS — L732 Hidradenitis suppurativa: Secondary | ICD-10-CM

## 2022-10-02 DIAGNOSIS — Z113 Encounter for screening for infections with a predominantly sexual mode of transmission: Secondary | ICD-10-CM | POA: Insufficient documentation

## 2022-10-02 DIAGNOSIS — F39 Unspecified mood [affective] disorder: Secondary | ICD-10-CM

## 2022-10-02 DIAGNOSIS — Z720 Tobacco use: Secondary | ICD-10-CM

## 2022-10-02 LAB — POCT WET PREP (WET MOUNT)
Clue Cells Wet Prep Whiff POC: NEGATIVE
Trichomonas Wet Prep HPF POC: ABSENT

## 2022-10-02 MED ORDER — METFORMIN HCL ER 500 MG PO TB24: 500.0000 mg | ORAL_TABLET | Freq: Every day | ORAL | 3 refills | Status: DC

## 2022-10-02 MED ORDER — BUPROPION HCL ER (XL) 150 MG PO TB24: 150.0000 mg | ORAL_TABLET | Freq: Every day | ORAL | 1 refills | Status: DC

## 2022-10-02 MED ORDER — AMLODIPINE BESYLATE 5 MG PO TABS: 5.0000 mg | ORAL_TABLET | Freq: Every day | ORAL | 1 refills | Status: DC

## 2022-10-02 NOTE — Assessment & Plan Note (Signed)
Improved.  She has decreased her total amount smoked per day.  Continue Wellbutrin 150 mg daily.

## 2022-10-02 NOTE — Assessment & Plan Note (Signed)
Mood is subjectively improved.  Continue Wellbutrin 150 for now.  Discussed up titration should she desire in the future.

## 2022-10-02 NOTE — Progress Notes (Addendum)
    SUBJECTIVE:   CHIEF COMPLAINT / HPI:   STI testing Had intercourse with known partner yesterday. She felt like she needed to get tested after this encounter. She has not had any symptoms. No discharge, pain, or itching. She was last sexually active with this partner 1 year ago. She is occasionally sexually active with other partners. She is not interested in birth control for now because she was told by the health department that she had blood clots from both the injectable form and the pill.  Mood disorder Feels her wellbutrin has really helped her moods. Would like to continue this dose for now.  HS Continues to have great improvement on metformin.  Tobacco use Since starting wellbutrin, she has went down from 1 ppd to 1 pack every 3 days. Her cravings are definitely decreased.  PERTINENT  PMH / PSH: narcolepsy  OBJECTIVE:   BP 126/74   Pulse (!) 103   Ht  (1.575 m)   Wt 293 lb 3.2 oz (133 kg)   LMP 09/28/2022   SpO2 94%   BMI 53.63 kg/m   General: Alert and oriented, in NAD HEENT: NCAT, EOM grossly normal, midline nasal septum Respiratory: Breathing and speaking comfortably on RA GU: External and internal vagina normal appearance, thin white discharge present Extremities: Moves all extremities grossly equally Neurological: No gross focal deficit Psychiatric: Appropriate mood and affect   ASSESSMENT/PLAN:   Essential hypertension Controlled.  Continue amlodipine 5 daily.  Hidradenitis suppurativa Remains improved on metformin.  Continue 500 mg daily.  Tobacco abuse Improved.  She has decreased her total amount smoked per day.  Continue Wellbutrin 150 mg daily.  Mood disorder (HCC) Mood is subjectively improved.  Continue Wellbutrin 150 for now.  Discussed up titration should she desire in the future.  Screening for STI Collected HIV, RPR, GC/chlamydia swabs, and trichomonas/BV/yeast swabs.  Will follow-up results and treat accordingly.  Janeal Holmes,  MD [facility]

## 2022-10-02 NOTE — Assessment & Plan Note (Signed)
Remains improved on metformin.  Continue 500 mg daily.

## 2022-10-02 NOTE — Assessment & Plan Note (Signed)
Controlled.  Continue amlodipine 5 daily.

## 2022-10-02 NOTE — Patient Instructions (Addendum)
It was great to see you today! Here's what we talked about:  We collected STI screening today. Please be sure to call the therapists. Let me know if you'd like to refill your wellbutrin. I have sent in refills of your medicines. Keep up the GREAT work with cutting down on smoking. 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 Follow up in 3 months or sooner if needed.  Please let me know if you have any other questions.  Dr. Phineas Real

## 2022-10-03 LAB — T PALLIDUM ANTIBODY, EIA: T pallidum Antibody, EIA: NEGATIVE

## 2022-10-03 LAB — HIV ANTIBODY (ROUTINE TESTING W REFLEX): HIV Screen 4th Generation wRfx: NONREACTIVE

## 2022-10-03 LAB — CERVICOVAGINAL ANCILLARY ONLY
Chlamydia: POSITIVE — AB
Comment: NEGATIVE
Comment: NORMAL
Neisseria Gonorrhea: NEGATIVE

## 2022-10-03 LAB — RPR W/REFLEX TO TREPSURE: RPR: NONREACTIVE

## 2022-10-03 MED ORDER — AZITHROMYCIN 500 MG PO TABS: 1000.0000 mg | ORAL_TABLET | Freq: Every day | ORAL | 0 refills | Status: AC

## 2022-10-03 NOTE — Addendum Note (Signed)
Addended by: Evette Georges B on: 10/03/2022 08:27 PM   Modules accepted: Orders

## 2022-10-21 ENCOUNTER — Ambulatory Visit (INDEPENDENT_AMBULATORY_CARE_PROVIDER_SITE_OTHER): Payer: Self-pay | Admitting: Family Medicine

## 2022-10-21 ENCOUNTER — Encounter: Payer: Self-pay | Admitting: Family Medicine

## 2022-10-21 VITALS — BP 125/98 | HR 88 | Ht 62.0 in | Wt 292.2 lb

## 2022-10-21 DIAGNOSIS — I10 Essential (primary) hypertension: Secondary | ICD-10-CM

## 2022-10-21 DIAGNOSIS — A749 Chlamydial infection, unspecified: Secondary | ICD-10-CM | POA: Insufficient documentation

## 2022-10-21 NOTE — Assessment & Plan Note (Signed)
Treated with Azithromycin 1g. Do not recommend repeat testing today as it has only been 2.5 weeks since initial infection. Counseled on safe sex practices including barrier protection. Follow-up in 3 months for repeat testing.

## 2022-10-21 NOTE — Patient Instructions (Addendum)
It was great to meet you!  Follow-up in 3 months (mid-July) to re-test for sexually transmitted infections.  Make sure all your sexual partners are tested and treated as well. Most importantly, use barrier protection (condoms) to prevent both pregnancy and sexually transmitted infections.  If you decide you're interested in any type of birth control, please let [place] know. Bedsider.org is an excellent resource to read more about the different options.    Take care, Dr Anner Crete

## 2022-10-21 NOTE — Assessment & Plan Note (Addendum)
Diastolic BP elevated today x2. Patient states she smoked a cigarette immediately prior to appointment. Advised close follow-up for BP re-check but patient declines. States she's on meds (amlodipine 5mg  daily) and BP has been normal at last 2 visits with PCP.

## 2022-10-21 NOTE — Progress Notes (Signed)
    SUBJECTIVE:   CHIEF COMPLAINT / HPI:   STI follow-up -Diagnosed with chlamydia on 10/02/22 (2.5 weeks ago) -Treated with Azithromycin 1g -Wants to make sure she's "all good" -Not having any symptoms -Has not been sexually active since diagnosis -Has multiple sex partners. Informed them but unsure if they were tested/treated -Not interested in any form of contraception  PERTINENT  PMH / PSH: HTN, OSA, tobacco abuse, mood disorder  OBJECTIVE:   BP (!) 125/98   Pulse 88   Ht 5\' 2"  (1.575 m)   Wt 292 lb 3.2 oz (132.5 kg)   LMP 09/28/2022 (Exact Date)   SpO2 100%   BMI 53.44 kg/m   General: NAD, able to participate in exam Respiratory: No respiratory distress Skin: warm and dry, no rashes noted Psych: Normal affect and mood Neuro: grossly intact  ASSESSMENT/PLAN:   Chlamydia Treated with Azithromycin 1g. Do not recommend repeat testing today as it has only been 2.5 weeks since initial infection. Counseled on safe sex practices including barrier protection. Follow-up in 3 months for repeat testing.  Essential hypertension Diastolic BP elevated today x2. Patient states she smoked a cigarette immediately prior to appointment. Advised close follow-up for BP re-check but patient declines. States she's on meds (amlodipine 5mg  daily) and BP has been normal at last 2 visits with PCP.     Maury Dus, MD [facility]

## 2022-12-23 ENCOUNTER — Encounter: Payer: Self-pay | Admitting: Family Medicine

## 2022-12-23 ENCOUNTER — Ambulatory Visit (INDEPENDENT_AMBULATORY_CARE_PROVIDER_SITE_OTHER): Payer: Self-pay | Admitting: Family Medicine

## 2022-12-23 ENCOUNTER — Other Ambulatory Visit (HOSPITAL_COMMUNITY)
Admission: RE | Admit: 2022-12-23 | Discharge: 2022-12-23 | Disposition: A | Discharge status: Home / Self Care | Payer: Self-pay | Source: Ambulatory Visit | Attending: Family Medicine | Admitting: Family Medicine

## 2022-12-23 VITALS — BP 114/88 | HR 59 | Ht 62.0 in | Wt 287.0 lb

## 2022-12-23 DIAGNOSIS — Z720 Tobacco use: Secondary | ICD-10-CM

## 2022-12-23 DIAGNOSIS — F39 Unspecified mood [affective] disorder: Secondary | ICD-10-CM

## 2022-12-23 DIAGNOSIS — L732 Hidradenitis suppurativa: Secondary | ICD-10-CM

## 2022-12-23 DIAGNOSIS — I10 Essential (primary) hypertension: Secondary | ICD-10-CM

## 2022-12-23 DIAGNOSIS — A749 Chlamydial infection, unspecified: Secondary | ICD-10-CM

## 2022-12-23 MED ORDER — AMLODIPINE BESYLATE 5 MG PO TABS: 5.0000 mg | ORAL_TABLET | Freq: Every day | ORAL | 1 refills | Status: DC

## 2022-12-23 MED ORDER — BUPROPION HCL ER (XL) 150 MG PO TB24: 150.0000 mg | ORAL_TABLET | Freq: Every day | ORAL | 1 refills | Status: DC

## 2022-12-23 MED ORDER — METFORMIN HCL ER 500 MG PO TB24: 500.0000 mg | ORAL_TABLET | Freq: Every day | ORAL | 3 refills | Status: DC

## 2022-12-23 NOTE — Assessment & Plan Note (Signed)
Has been out of wellbutrin and mood has suffered, though has been going to therapy with good results. No SI/HI today. Refilled wellbutrin and advised to use GoodRx to get cheaper prescription. Patient to call me with any difficulties obtaining medication.

## 2022-12-23 NOTE — Patient Instructions (Signed)
It was great to see you today! Here's what we talked about:  Use GoodRx to get your wellbutrin. I have sent in a refill. Call me if this does not work! Keep seeing your therapist. Tiffany Allen are doing a great job! The wellbutrin should help with tobacco cravings. Let me know if you would like some nicotine patches in the future. Your blood pressure has remained a little higher. This could be due to some nicotine withdrawal/mood. You can use antibiotic creams at home for your HS during menstrual periods to help reduce flares.  Please let me know if you have any other questions.  Dr. Phineas Real

## 2022-12-23 NOTE — Assessment & Plan Note (Signed)
In flare currently with menstrual cycle. On metformin. Advised to use topical antibiotic therapy from previous visits through menstrual period. Can discuss surgical intervention if continues to be a problem.

## 2022-12-23 NOTE — Progress Notes (Signed)
    SUBJECTIVE:   CHIEF COMPLAINT / HPI:   Mood disorder Has been out of wellbutrin for about a month because the pharmacy was going to make her pay over 100 dollars. She would like to continue using this, though.  HS On metformin. Has a flare currently due to her menstrual cycle. Says this is typical. Most of the time does not have flares since starting metformin.  Tobacco use Was at 1 pack every 3 days in 09/2022 with wellbutrin. Has been completely stopped since Friday because she does not have finances to buy more. She has been more irritable during this time.  Treated chlamydia Treated with azithromycin 1g in April. Had not been sexually active again until 1 week ago. Wants to ensure she has cleared the chlamydia for future encounters.  OBJECTIVE:   Previous BP 121/91 BP 114/88   Pulse (!) 59   Ht 5\' 2"  (1.575 m)   Wt 287 lb (130.2 kg)   LMP 11/22/2022 (Exact Date)   SpO2 100%   BMI 52.49 kg/m   General: Alert and oriented, in NAD Skin: Warm, dry, and intact; photos on phone with multiple cystic lesions with active draining under breasts and on side HEENT: NCAT, EOM grossly normal, midline nasal septum Extremities: Moves all extremities grossly equally Neurological: No gross focal deficit Psychiatric: Appropriate mood and affect   ASSESSMENT/PLAN:   Essential hypertension Controlled on amlodipine 5 mg. DBP slightly elevated at last visit and today. Could be secondary to nicotine withdrawal/mood disorder flare up without tobacco and wellbutrin. Continue therapy and reassess next visit.  Hidradenitis suppurativa In flare currently with menstrual cycle. On metformin. Advised to use topical antibiotic therapy from previous visits through menstrual period. Can discuss surgical intervention if continues to be a problem.  Tobacco abuse Now without tobacco for 4 days. Likely has some element of nicotine withdrawal. Declines nicotine supplementation given little efficacy in  past. Advised to continue wellbutrin and let me know should she desire nicotine replacement in future.  Mood disorder (HCC) Has been out of wellbutrin and mood has suffered, though has been going to therapy with good results. No SI/HI today. Refilled wellbutrin and advised to use GoodRx to get cheaper prescription. Patient to call me with any difficulties obtaining medication.  Chlamydia Treatment likely successful given lack of symptoms. Will retest with urine at patient strong preference, however. Follow up results.  Janeal Holmes, MD [facility]

## 2022-12-23 NOTE — Assessment & Plan Note (Signed)
Controlled on amlodipine 5 mg. DBP slightly elevated at last visit and today. Could be secondary to nicotine withdrawal/mood disorder flare up without tobacco and wellbutrin. Continue therapy and reassess next visit.

## 2022-12-23 NOTE — Assessment & Plan Note (Signed)
Treatment likely successful given lack of symptoms. Will retest with urine at patient strong preference, however. Follow up results.

## 2022-12-23 NOTE — Assessment & Plan Note (Signed)
Now without tobacco for 4 days. Likely has some element of nicotine withdrawal. Declines nicotine supplementation given little efficacy in past. Advised to continue wellbutrin and let me know should she desire nicotine replacement in future.

## 2022-12-24 LAB — URINE CYTOLOGY ANCILLARY ONLY
Chlamydia: NEGATIVE
Comment: NEGATIVE
Comment: NEGATIVE
Comment: NORMAL
Neisseria Gonorrhea: NEGATIVE
Trichomonas: NEGATIVE

## 2023-01-29 ENCOUNTER — Encounter (HOSPITAL_COMMUNITY): Payer: Self-pay

## 2023-01-29 ENCOUNTER — Ambulatory Visit (HOSPITAL_COMMUNITY)
Admission: EM | Admit: 2023-01-29 | Discharge: 2023-01-29 | Disposition: A | Discharge status: Home / Self Care | Payer: Self-pay | Attending: Emergency Medicine | Admitting: Emergency Medicine

## 2023-01-29 DIAGNOSIS — N390 Urinary tract infection, site not specified: Secondary | ICD-10-CM | POA: Insufficient documentation

## 2023-01-29 DIAGNOSIS — R350 Frequency of micturition: Secondary | ICD-10-CM | POA: Insufficient documentation

## 2023-01-29 LAB — POCT URINALYSIS DIP (MANUAL ENTRY)
Glucose, UA: NEGATIVE mg/dL
Ketones, POC UA: NEGATIVE mg/dL — AB
Nitrite, UA: NEGATIVE
Protein Ur, POC: 30 mg/dL — AB
Spec Grav, UA: >=1.03 — AB (ref 1.010–1.025)
Urobilinogen, UA: 0.2 E.U./dL
pH, UA: 5.5 (ref 5.0–8.0)

## 2023-01-29 LAB — POCT URINE PREGNANCY: Preg Test, Ur: NEGATIVE

## 2023-01-29 MED ORDER — CEPHALEXIN 500 MG PO CAPS: 500.0000 mg | ORAL_CAPSULE | Freq: Four times a day (QID) | ORAL | 0 refills | Status: DC

## 2023-01-29 NOTE — ED Provider Notes (Signed)
[facility]    CSN: [id number] Arrival date & time: 01/29/23  1051      History   Chief Complaint Chief Complaint  Patient presents with   Urinary Frequency    HPI Tiffany Allen is a 37 y.o. female.   Patient presents with urinary frequency and right lower quadrant and flank pain.  Patient states that she has had a UTI before and these are similar symptoms.  She wanted to have urine checked just to make sure.  Denies any nausea vomiting or diarrhea.  No vaginal discharge.  Has not taken anything prior to arrival    Past Medical History:  Diagnosis Date   Hypertension    does not take meds   Narcolepsy    OSA (obstructive sleep apnea)    RLS (restless legs syndrome)     Patient Active Problem List   Diagnosis Date Noted   Chlamydia 10/21/2022   Mood disorder (HCC) 04/03/2022   Mass of left breast 03/10/2022   Hidradenitis suppurativa 01/03/2021   Financial difficulties 01/03/2021   PCOS (polycystic ovarian syndrome) 01/03/2021   Essential hypertension 04/11/2020   Tobacco abuse 04/11/2020   SOB (shortness of breath) 04/11/2020   Morbid obesity (HCC) 01/27/2017   OBSTRUCTIVE SLEEP APNEA 03/29/2008   RESTLESS LEGS SYNDROME 03/29/2008   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 No pertinent surgical history.  OB History   No obstetric history on file.      Home Medications    Prior to Admission medications   Medication Sig Start Date End Date Taking? Authorizing Provider  cephALEXin (KEFLEX) 500 MG capsule Take 1 capsule (500 mg total) by mouth 4 (four) times daily. 01/29/23  Yes Coralyn Mark, NP  amLODipine (NORVASC) 5 MG tablet Take 1 tablet (5 mg total) by mouth daily. 12/23/22   Evette Georges, MD  ASPIRIN PO Take 1 tablet by mouth daily as needed (for chest pain).    [provider]  buPROPion (WELLBUTRIN XL) 150 MG 24 hr tablet Take 1 tablet (150 mg total) by mouth daily. 12/23/22   Evette Georges, MD   metFORMIN (GLUCOPHAGE-XR) 500 MG 24 hr tablet Take 1 tablet (500 mg total) by mouth daily with breakfast. 12/23/22 12/18/23  Evette Georges, MD    Family History Family History  Problem Relation Age of Onset   Atrial fibrillation Mother    Diabetes Paternal Grandmother     Social History Social History   Tobacco Use   Smoking status: Every Day    Current packs/day: 0.50    Average packs/day: 0.5 packs/day for 8.0 years (4.0 ttl pk-yrs)    Types: Cigarettes   Smokeless tobacco: Never  Vaping Use   Vaping status: Never Used  Substance Use Topics   Alcohol use: No   Drug use: No     Allergies   Adhesive [tape], Diphenhydramine hcl, Ibuprofen, and Latex   Review of Systems Review of Systems  Constitutional:  Negative for chills and fever.  Respiratory: Negative.    Cardiovascular: Negative.   Gastrointestinal:  Positive for abdominal pain.  Genitourinary:  Positive for frequency, pelvic pain and urgency.     Physical Exam Triage Vital Signs ED Triage Vitals  Encounter Vitals Group     BP 01/29/23 1141 (!) 130/91     Systolic BP Percentile --      Diastolic BP Percentile --      Pulse Rate 01/29/23 1140 77     Resp 01/29/23 1140 16  Temp 01/29/23 1140 98.2 F (36.8 C)     Temp Source 01/29/23 1140 Oral     SpO2 01/29/23 1140 98 %     Weight --      Height --      Head Circumference --      Peak Flow --      Pain Score 01/29/23 1141 10     Pain Loc --      Pain Education --      Exclude from Growth Chart --    No data found.  Updated Vital Signs BP (!) 130/91 (BP Location: Right Arm)   Pulse 77   Temp 98.2 F (36.8 C) (Oral)   Resp 16   LMP 01/17/2023 (Approximate)   SpO2 98%   Visual Acuity Right Eye Distance:   Left Eye Distance:   Bilateral Distance:    Right Eye Near:   Left Eye Near:    Bilateral Near:     Physical Exam Constitutional:      Appearance: Normal appearance.  Cardiovascular:     Rate and Rhythm: Normal rate.   Pulmonary:     Effort: Pulmonary effort is normal.  Abdominal:     General: Bowel sounds are normal.     Tenderness: There is right CVA tenderness. There is no left CVA tenderness.  Neurological:     Mental Status: She is alert.      UC Treatments / Results  Labs (all labs ordered are listed, but only abnormal results are displayed) Labs Reviewed  POCT URINALYSIS DIP (MANUAL ENTRY) - Abnormal; Notable for the following components:      Result Value   Color, UA straw (*)    Bilirubin, UA small (*)    Ketones, POC UA negative (*)    Spec Grav, UA >=1.030 (*)    Blood, UA small (*)    Protein Ur, POC =30 (*)    Leukocytes, UA Trace (*)    All other components within normal limits  URINE CULTURE  POCT URINE PREGNANCY    EKG   Radiology No results found.  Procedures Procedures (including critical care time)  Medications Ordered in UC Medications - No data to display  Initial Impression / Assessment and Plan / UC Course  I have reviewed the triage vital signs and the nursing notes.  Pertinent labs & imaging results that were available during my care of the patient were reviewed by me and considered in my medical decision making (see chart for details).     Urine lab test is positive for urinary tract infection Drink plenty of fluids and stay hydrated Take full dose of antibiotics even if you start to feel better Take Tylenol as needed for pain Symptoms become worse and you have a fever greater than 101.1 You need to be seen in the emergency room for further testing We will send your urine off for a culture if anything returns in 3 days we will contact you  Final Clinical Impressions(s) / UC Diagnoses   Final diagnoses:  Lower urinary tract infectious disease  Frequency of urination     Discharge Instructions      Urine lab test is positive for urinary tract infection Drink plenty of fluids and stay hydrated Take full dose of antibiotics even if you  start to feel better Take Tylenol as needed for pain Symptoms become worse and you have a fever greater than 101.1 You need to be seen in the emergency room for further testing  We will send your urine off for a culture if anything returns in 3 days we will contact you     ED Prescriptions     Medication Sig Dispense Auth. Provider   cephALEXin (KEFLEX) 500 MG capsule Take 1 capsule (500 mg total) by mouth 4 (four) times daily. 20 capsule Coralyn Mark, NP      PDMP not reviewed this encounter.   Coralyn Mark, NP 01/29/23 1205

## 2023-01-29 NOTE — ED Triage Notes (Signed)
Pt states lower back pain with urinary frequency and burning with urination for the past 3 days.

## 2023-01-29 NOTE — Discharge Instructions (Addendum)
Urine lab test is positive for urinary tract infection Drink plenty of fluids and stay hydrated Take full dose of antibiotics even if you start to feel better Take Tylenol as needed for pain Symptoms become worse and you have a fever greater than 101.1 You need to be seen in the emergency room for further testing We will send your urine off for a culture if anything returns in 3 days we will contact you

## 2023-01-31 LAB — URINE CULTURE: Culture: >=100000 — AB

## 2023-03-10 ENCOUNTER — Telehealth: Payer: Self-pay

## 2023-03-10 NOTE — Telephone Encounter (Signed)
Patient calls nurse line regarding request for doxycycline refill.   She reports that she is having a flare up with her Hydradenitis. Onset 3 days ago.   I have scheduled patient follow up with PCP on 04/06/23 to follow up on her HS.   If appropriate, please send prescription to [facility].   If patient needs to be seen by another provider sooner, please reach out to your team to schedule appointment.   Veronda Prude, RN

## 2023-03-12 NOTE — Telephone Encounter (Signed)
Called patient and scheduled appointment for tomorrow 03/13/23.  Thank you  Tiffany Allen

## 2023-03-13 ENCOUNTER — Ambulatory Visit (INDEPENDENT_AMBULATORY_CARE_PROVIDER_SITE_OTHER): Payer: Medicaid Other | Admitting: Family Medicine

## 2023-03-13 VITALS — BP 124/86 | HR 99 | Ht 62.0 in | Wt 280.6 lb

## 2023-03-13 DIAGNOSIS — L732 Hidradenitis suppurativa: Secondary | ICD-10-CM

## 2023-03-13 MED ORDER — CEFADROXIL 500 MG PO CAPS: 500.0000 mg | ORAL_CAPSULE | Freq: Two times a day (BID) | ORAL | 0 refills | Status: AC

## 2023-03-13 NOTE — Patient Instructions (Signed)
It was wonderful to see you today! Thank you for choosing [facility].   Please bring ALL of your medications with you to every visit.   Today we talked about:  Please take the antibiotic twice a day for the next 7 days for your flare.  I also placed a referral for dermatology for follow-up on more extensive treatment options.  You should hear back from our office this referral in the next 1 to 2 weeks. Please keep in mind scheduling with Dermatology is often a few months wait.  I also noted in your chart that you have recurrent flares and prescribing antibiotics without an appointment is appropriate.  Please follow up with PCP for routine care or persistent symptoms  Call the clinic at [phone] if your symptoms worsen or you have any concerns.  Please be sure to schedule follow up at the front desk before you leave today.   Elberta Fortis, DO Family Medicine

## 2023-03-13 NOTE — Progress Notes (Signed)
    SUBJECTIVE:   CHIEF COMPLAINT / HPI:   Hidradenitis flare Reports flare of the past few days with pain, erythema and drainage of sites under both breasts, lower abdominal region, groin and medial thigh.  Reports getting a flare every 2 months, at least 5-6 flares per year.  Reports antibiotics improve her symptoms greatly.  Has considered surgical intervention but states she is not a good candidate given history of narcolepsy.  Interested in more advanced treatments such as Biologics.  PERTINENT  PMH / PSH: HS, narcolepsy, hypertension, sleep apnea, PCOS  OBJECTIVE:   BP 124/86   Pulse 99   Ht 5\' 2"  (1.575 m)   Wt 280 lb 9.6 oz (127.3 kg)   LMP 03/02/2023   SpO2 100%   BMI 51.32 kg/m   General: NAD, pleasant, able to participate in exam Skin: Multiple areas of induration, erythema and purulent drainage located under bilateral breast, abdominal folds, inguinal region and medial left thigh.  ASSESSMENT/PLAN:   Hidradenitis suppurativa Acute flare of chronic underlying condition. Having flare about 5-6 times per year. -Cefadroxil twice daily x 7 days -Referral to dermatology to discuss possible biologic therapy -Recommend patient discuss candidacy for surgical intervention with pulmonologist given history of narcolepsy -Recommend patient receive antibiotic therapy during flare episodes even if unable to be seen for evaluation given chronic nature of her condition   Dr. Elberta Fortis, DO [facility]

## 2023-03-13 NOTE — Assessment & Plan Note (Addendum)
Acute flare of chronic underlying condition. Having flare about 5-6 times per year. -Cefadroxil twice daily x 7 days -Referral to dermatology to discuss possible biologic therapy -Recommend patient discuss candidacy for surgical intervention with pulmonologist given history of narcolepsy -Recommend patient receive antibiotic therapy during flare episodes even if unable to be seen for evaluation given chronic nature of her condition

## 2023-04-06 ENCOUNTER — Ambulatory Visit: Payer: Medicaid Other | Admitting: Family Medicine

## 2023-04-21 ENCOUNTER — Ambulatory Visit (INDEPENDENT_AMBULATORY_CARE_PROVIDER_SITE_OTHER): Payer: Medicaid Other | Admitting: Family Medicine

## 2023-04-21 ENCOUNTER — Encounter: Payer: Self-pay | Admitting: Family Medicine

## 2023-04-21 VITALS — BP 140/101 | HR 81 | Ht 62.0 in | Wt 282.0 lb

## 2023-04-21 DIAGNOSIS — I10 Essential (primary) hypertension: Secondary | ICD-10-CM | POA: Diagnosis not present

## 2023-04-21 DIAGNOSIS — L732 Hidradenitis suppurativa: Secondary | ICD-10-CM | POA: Diagnosis present

## 2023-04-21 DIAGNOSIS — Z720 Tobacco use: Secondary | ICD-10-CM

## 2023-04-21 DIAGNOSIS — F39 Unspecified mood [affective] disorder: Secondary | ICD-10-CM | POA: Diagnosis not present

## 2023-04-21 MED ORDER — AMLODIPINE BESYLATE 10 MG PO TABS: 10.0000 mg | ORAL_TABLET | Freq: Every day | ORAL | 0 refills | Status: DC

## 2023-04-21 MED ORDER — METFORMIN HCL ER 500 MG PO TB24: 1000.0000 mg | ORAL_TABLET | Freq: Every day | ORAL | 0 refills | Status: DC

## 2023-04-21 MED ORDER — BUPROPION HCL ER (XL) 300 MG PO TB24: 300.0000 mg | ORAL_TABLET | Freq: Every day | ORAL | 0 refills | Status: DC

## 2023-04-21 NOTE — Assessment & Plan Note (Signed)
Unfortunately flare is no longer controlled.  Minimal to mild improvement with systemic and topical antibiotics.  Initial improvement with metformin 500 mg daily.  Because of this, will increase to 1000 mg daily.  Await dermatology appointment though question affordability of next interventions.  Given she does have coexisting hypertension, could also consider adding spironolactone for more antiandrogenic effects at next visit if tolerated.  Can also consider long-term clindamycin.  Follow-up in 2 weeks.

## 2023-04-21 NOTE — Assessment & Plan Note (Signed)
Last couple readings in office with elevated BP.  While current situation is likely contributing, will go up to amlodipine 10 mg daily.  Assess response next visit.

## 2023-04-21 NOTE — Assessment & Plan Note (Signed)
Nice decrease in cravings with welbutrin though still smoking in light of her recent stressors.  Will continue to provide support when she is ready to stop smoking.

## 2023-04-21 NOTE — Patient Instructions (Signed)
We will go up on your amlodipine to 10 mg. I have also increased your metformin to 1000 mg and your wellbutrin to 300 mg daily. Keep seeing your therapist, and let me know if this increased dose of wellbutrin is not helping as much as it could!

## 2023-04-21 NOTE — Progress Notes (Signed)
    SUBJECTIVE:   CHIEF COMPLAINT / HPI:   HS follow up Has had more flares recently. They are spreading. Recently seen on 03/13/2023 and given abx. The abx helped for the first two days.  However, she continued to have spreading of the boils.  She has not heard back from dermatology about a follow up appointment.  Mood disorder Has been particularly down recently.  Last week she did have thoughts of hurting herself and talked about this with her therapist.  No active plan.  No HI.  She does not have these thoughts today.  She continues to take her Wellbutrin 150 daily as well as follows with her therapist biweekly.  Hypertension Has been taking her amlodipine.  Feels it is high today because she has been more stressed and in pain from the HS.  She also just smoked before coming in.  Tobacco use Still smoking daily given recent stressors.  The Wellbutrin has helped to decrease cravings.  She feels like it is strictly more of a habit at this point.  PERTINENT  PMH / PSH: PCOS, tobacco use, narcolepsy  OBJECTIVE:   BP (!) 140/101   Pulse 81   Ht 5\' 2"  (1.575 m)   Wt 282 lb (127.9 kg)   SpO2 100%   BMI 51.58 kg/m   General: Alert and oriented, in NAD Skin: Warm, dry; photos shown by patient with multiple boils with surrounding erythema and some with active drainage HEENT: NCAT, EOM grossly normal, midline nasal septum Cardiac: Regular rate Respiratory: Breathing and speaking comfortably on RA Extremities: Moves all extremities grossly equally Neurological: No gross focal deficit Psychiatric: Appropriate mood and affect, no active SI/HI, PHQ-9 positive #9=2  ASSESSMENT/PLAN:   Essential hypertension Last couple readings in office with elevated BP.  While current situation is likely contributing, will go up to amlodipine 10 mg daily.  Assess response next visit.  Hidradenitis suppurativa Unfortunately flare is no longer controlled.  Minimal to mild improvement with systemic and  topical antibiotics.  Initial improvement with metformin 500 mg daily.  Because of this, will increase to 1000 mg daily.  Await dermatology appointment though question affordability of next interventions.  Given she does have coexisting hypertension, could also consider adding spironolactone for more antiandrogenic effects at next visit if tolerated.  Can also consider long-term clindamycin.  Follow-up in 2 weeks.  Tobacco abuse Nice decrease in cravings with welbutrin though still smoking in light of her recent stressors.  Will continue to provide support when she is ready to stop smoking.  Mood disorder (HCC) Uncontrolled.  Connected with therapist and on medication therapy.  Reassuringly no active SI/HI today in office.  Will increase Wellbutrin to 300 mg daily.  Advised to call the office and her therapist for an appointment should her mood worsen.  If in crisis, advised to call the [phone] number.  Follow-up mood and tolerance with medication in 2 weeks.   Janeal Holmes, MD [facility]

## 2023-04-21 NOTE — Assessment & Plan Note (Signed)
Uncontrolled.  Connected with therapist and on medication therapy.  Reassuringly no active SI/HI today in office.  Will increase Wellbutrin to 300 mg daily.  Advised to call the office and her therapist for an appointment should her mood worsen.  If in crisis, advised to call the [phone] number.  Follow-up mood and tolerance with medication in 2 weeks.

## 2023-04-27 ENCOUNTER — Other Ambulatory Visit: Payer: Self-pay

## 2023-04-27 ENCOUNTER — Encounter (HOSPITAL_BASED_OUTPATIENT_CLINIC_OR_DEPARTMENT_OTHER): Payer: Self-pay

## 2023-04-27 ENCOUNTER — Emergency Department (HOSPITAL_BASED_OUTPATIENT_CLINIC_OR_DEPARTMENT_OTHER)
Admission: EM | Admit: 2023-04-27 | Discharge: 2023-04-27 | Disposition: A | Discharge status: Home / Self Care | Payer: Medicaid Other | Attending: Emergency Medicine | Admitting: Emergency Medicine

## 2023-04-27 DIAGNOSIS — R6884 Jaw pain: Secondary | ICD-10-CM | POA: Diagnosis present

## 2023-04-27 DIAGNOSIS — X58XXXA Exposure to other specified factors, initial encounter: Secondary | ICD-10-CM | POA: Diagnosis not present

## 2023-04-27 DIAGNOSIS — Z91040 Latex allergy status: Secondary | ICD-10-CM | POA: Diagnosis not present

## 2023-04-27 DIAGNOSIS — S0302XA Dislocation of jaw, left side, initial encounter: Secondary | ICD-10-CM | POA: Insufficient documentation

## 2023-04-27 DIAGNOSIS — Z7982 Long term (current) use of aspirin: Secondary | ICD-10-CM | POA: Insufficient documentation

## 2023-04-27 DIAGNOSIS — S0300XA Dislocation of jaw, unspecified side, initial encounter: Secondary | ICD-10-CM

## 2023-04-27 NOTE — ED Provider Notes (Signed)
[facility] EMERGENCY DEPARTMENT AT [facility] Provider Note   CSN: [id number] Arrival date & time: 04/27/23  1947     History  Chief Complaint  Patient presents with   Jaw Pain    Tiffany Allen is a 37 y.o. female.  Patient reports last night the left side of her jaw popped.  Patient states she felt like her jaw popped out of place.  Patient complains of continued soreness and pain.  Patient reports she is able to put her teeth together.  She is able to chew.  Patient reports discomfort when she opens her mouth.  She cannot fully open her mouth due to pain.  Patient denies any previous history of jaw problems.  She has no other area of complaint  The history is provided by the patient. No language interpreter was used.       Home Medications Prior to Admission medications   Medication Sig Start Date End Date Taking? Authorizing Provider  amLODipine (NORVASC) 10 MG tablet Take 1 tablet (10 mg total) by mouth daily. 04/21/23   Evette Georges, MD  ASPIRIN PO Take 1 tablet by mouth daily as needed (for chest pain).    [provider]  buPROPion (WELLBUTRIN XL) 300 MG 24 hr tablet Take 1 tablet (300 mg total) by mouth daily. 04/21/23 07/20/23  Evette Georges, MD  metFORMIN (GLUCOPHAGE-XR) 500 MG 24 hr tablet Take 2 tablets (1,000 mg total) by mouth daily with breakfast. 04/21/23 07/20/23  Evette Georges, MD      Allergies    Adhesive [tape], Diphenhydramine hcl, Ibuprofen, and Latex    Review of Systems   Review of Systems  All other systems reviewed and are negative.   Physical Exam Updated Vital Signs BP (!) 173/113 (BP Location: Right Arm)   Pulse (!) 105   Temp 98.1 F (36.7 C) (Oral)   Resp 18   Ht 5\' 2"  (1.575 m)   Wt 123.4 kg   LMP 04/03/2023   SpO2 96%   BMI 49.75 kg/m  Physical Exam Vitals and nursing note reviewed.  Constitutional:      Appearance: She is well-developed.  HENT:     Head: Normocephalic.     Nose: Nose normal.      Mouth/Throat:     Mouth: Mucous membranes are moist.     Comments: Tender left mandible, slight swelling, teeth appear to align normally, Cardiovascular:     Rate and Rhythm: Normal rate.  Pulmonary:     Effort: Pulmonary effort is normal.  Abdominal:     General: There is no distension.  Musculoskeletal:        General: Normal range of motion.     Cervical back: Normal range of motion.  Skin:    General: Skin is warm.  Neurological:     General: No focal deficit present.     Mental Status: She is alert and oriented to person, place, and time.  Psychiatric:        Mood and Affect: Mood normal.     ED Results / Procedures / Treatments   Labs (all labs ordered are listed, but only abnormal results are displayed) Labs Reviewed - No data to display  EKG None  Radiology No results found.  Procedures Procedures    Medications Ordered in ED Medications - No data to display  ED Course/ Medical Decision Making/ A&P  Medical Decision Making Patient reports that her jaw popped yesterday.  Patient reports she is swelling and pain now  Risk Risk Details: Dr. Andria Meuse in to see.  Pt counseled on jaw dislocation.  Pt's jaw is currently relocated.  Pt advised to return if any problems.             Final Clinical Impression(s) / ED Diagnoses Final diagnoses:  Dislocation of temporomandibular joint, initial encounter    Rx / DC Orders ED Discharge Orders     None      An After Visit Summary was printed and given to the patient.    Elson Areas, PA-C 04/27/23 2342    Anders Simmonds T, DO 04/28/23 2239

## 2023-04-27 NOTE — ED Triage Notes (Signed)
Pt said that last night it felt like her jaw popped out of place. Has been swollen and she can hardly open her mouth today.

## 2023-04-27 NOTE — Discharge Instructions (Signed)
Return if any problems.  Follow up with your Physician for recheck  

## 2023-05-05 ENCOUNTER — Ambulatory Visit: Payer: Medicaid Other | Admitting: Family Medicine

## 2023-05-05 ENCOUNTER — Encounter: Payer: Self-pay | Admitting: Family Medicine

## 2023-05-05 VITALS — BP 120/74 | HR 89 | Wt 274.0 lb

## 2023-05-05 DIAGNOSIS — F339 Major depressive disorder, recurrent, unspecified: Secondary | ICD-10-CM | POA: Diagnosis present

## 2023-05-05 DIAGNOSIS — L732 Hidradenitis suppurativa: Secondary | ICD-10-CM | POA: Diagnosis not present

## 2023-05-05 DIAGNOSIS — I10 Essential (primary) hypertension: Secondary | ICD-10-CM | POA: Diagnosis not present

## 2023-05-05 MED ORDER — DOXYCYCLINE HYCLATE 100 MG PO TABS: 100.0000 mg | ORAL_TABLET | Freq: Every day | ORAL | 0 refills | Status: AC

## 2023-05-05 MED ORDER — BUPROPION HCL ER (XL) 150 MG PO TB24: ORAL_TABLET | ORAL | 0 refills | Status: DC

## 2023-05-05 NOTE — Assessment & Plan Note (Addendum)
Remains uncontrolled.  No active SI today, but she is having her usual passive SI. Unable to tolerate 300 mg of Wellbutrin.  Will go down to 225 mg (a tablet and a half) of Wellbutrin.  Continue therapy.  If refractory, given she cannot tolerate SSRIs/SNRI, consider referral to psychiatry for likely addition of another agent like Abilify.  She is aware of the 27 number to call should she be in crisis, and her godmother is a good support system for her.

## 2023-05-05 NOTE — Assessment & Plan Note (Addendum)
Not much improved with increase metformin dose to 1000 mg daily.  Will start doxycycline 100 mg daily for prophylaxis.  Will follow-up improvement in 3 months.  Will also reach out to referral coordinator about dermatology appointment.  Given refractory nature of this as well as below depression, completed letter of medical necessity to be submitted for disability.

## 2023-05-05 NOTE — Patient Instructions (Addendum)
I have written a statement on your health conditions. I will refer you to a social worker who may can help with your bills and disability. Take one and a half tablets of the 150 mg wellbutrin for a total of 225 mg daily. Let me know how this goes. I have also sent in daily doxycycline. I will contact our referral coordinator for the dermatologist. They may be backed up with referrals! Keep up in therapy.

## 2023-05-05 NOTE — Progress Notes (Signed)
    SUBJECTIVE:   CHIEF COMPLAINT / HPI:   Hidradenitis suppurativa checkup Has been taking 1000 mg metformin daily.  Does not like it has been helping much.  However, she is overall improved from her first visit back in September.  Depression Not able to tolerate the 300 mg of Wellbutrin due to feeling off.  Wants to try something different.  Has been going to her therapist at the [facility] every 2 weeks with a lot of help.  Form completion for disability In addition to her narcolepsy, her hidradenitis suppurativa and depression have been making it difficult for her to keep gainful employment.  She is trying to get disability for this.  PERTINENT  PMH / PSH: Hypertension  OBJECTIVE:   BP 120/74   Pulse 89   Wt 274 lb (124.3 kg)   LMP 04/03/2023   SpO2 98%   BMI 50.12 kg/m   General: Alert and oriented, in NAD Skin: Warm, dry, and intact without lesions HEENT: NCAT, EOM grossly normal, midline nasal septum Cardiac: Regular rate Respiratory: CTAB, breathing and speaking comfortably on RA Extremities: Moves all extremities grossly equally Neurological: No gross focal deficit Psychiatric: Appropriate mood and affect, PHQ-9 16 with answer of 1 to #9  ASSESSMENT/PLAN:   Essential hypertension Controlled with amlodipine 10 mg daily.  Continue management.  Hidradenitis suppurativa Not much improved with increase metformin dose to 1000 mg daily.  Will start doxycycline 100 mg daily for prophylaxis.  Will follow-up improvement in 3 months.  Will also reach out to referral coordinator about dermatology appointment.  Given refractory nature of this as well as below depression, completed letter of medical necessity to be submitted for disability.  Major depression, recurrent, chronic (HCC) Remains uncontrolled.  No active SI today, but she is having her usual passive SI. Unable to tolerate 300 mg of Wellbutrin.  Will go down to 225 mg (a tablet and a half) of Wellbutrin.   Continue therapy.  If refractory, given she cannot tolerate SSRIs/SNRI, consider referral to psychiatry for likely addition of another agent like Abilify.  She is aware of the 83 number to call should she be in crisis, and her godmother is a good support system for her.   Janeal Holmes, MD [facility]

## 2023-05-05 NOTE — Assessment & Plan Note (Signed)
Controlled with amlodipine 10 mg daily.  Continue management.

## 2023-05-05 NOTE — Progress Notes (Signed)
Error

## 2023-05-06 ENCOUNTER — Encounter: Payer: Self-pay | Admitting: Family Medicine

## 2023-05-31 NOTE — Progress Notes (Unsigned)
Patient ID: Tiffany Allen, female   DOB: 01/12/1986, 37 y.o.   MRN: [id number]  01/26/17- 37 year old female for sleep evaluation. 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  FOLLOWS FOR: Former [facility] patient for Narcolepsy as well. Had sleep study in past and had CPAP through [facility]-could not tolerate. Had been on Provigil in past as well and declined using Ritalin rx.  Epworth 22/24 Medical problem list includes obstructive sleep apnea, restless legs syndrome, narcolepsy without cataplexy NPSG 01/29/06- AHI 9/ hr, desaturation to 81%, + PLMs with arousal MSLT 1//5/04 - 2.4 minutes with 5 SOREMS Had seen Dr. Shelle Iron in 2013- Nuvigil seemed to cause loose stools. She was concerned about potential for cardiovascular side effects possible with Ritalin. At that time she was using CPAP but says she stopped because she didn't like the mask fit. Could not afford Requip. She has not been on treatment in several years. Apparently she has just been cut off food stamps and subsidized housing, precipitating visit today. She asks a letter saying she is under our care. Her primary complaint is variable daytime sleepiness, drifting between alertness and sleepiness throughout day and night. Bedtime around 10 PM, 30 minutes sleep latency, waking at least 5 times before up at 11 AM. ENT surgery-none She doesn't recognize much sleep disturbance related to limb movement now and denies symptoms of cataplexy. She does admit occasional definite sleep paralysis.  04/30/17- 37 yoF smoker followed for Narcolepsy compicated by obesity --Pt is no bettter since last visit 3 months ago, patient is not sleeping at all each night U difficulty maintaining wakefulness in the daytime and sleep at night.  No cataplexy.  Viewed her sleep study, confirming absence of significant apnea at night.  Time study confirmed severe somnolence consistent with narcolepsy. NPSG 03/03/17-  AHI 0.5/ hr, desaturation to a nadir  of88%.281 lbs. MSLT- 03/04/17  Mean latency 0:04 minutes, slept 5/5 naps, SOREM 2/5 naps She is firmly against trying Ritalin or Adderall because she thinks Dr Shelle Iron she would become dependent on these and that they had other unacceptable side effects.  Education effort made. She is willing to try modafinil. ====================================================================================== 06/02/23- 37 yoF Smoker coming to re-establish with Narcolepsy, complicated by Morbid Obesity, Hidradenitis, HTN, Tobacco Use,    ROS-see HPI  + = positive Constitutional:    weight loss, night sweats, fevers, chills, + fatigue, lassitude. HEENT:    headaches, difficulty swallowing, tooth/dental problems, sore throat,       sneezing, itching, ear ache, nasal congestion, post nasal drip, snoring CV:    chest pain, orthopnea, PND, swelling in lower extremities, anasarca,                                                         dizziness, palpitations Resp:   shortness of breath with exertion or at rest.                productive cough,   non-productive cough, coughing up of blood.              change in color of mucus.  wheezing.   Skin:    rash or lesions. GI:  No-   heartburn, indigestion, abdominal pain, nausea, vomiting, diarrhea,  change in bowel habits, loss of appetite GU: dysuria, change in color of urine, no urgency or frequency.   flank pain. MS:   joint pain, stiffness, decreased range of motion, back pain. Neuro-     nothing unusual Psych:  change in mood or affect.  depression or anxiety.   memory loss.  OBJ- Physical Exam General- Alert, Oriented, +drowsy/passive, Distress- none acute, + morbidly obese Skin- rash-none, lesions- none, excoriation- none Lymphadenopathy- none Head- atraumatic            Eyes- Gross vision intact, PERRLA, conjunctivae and secretions clear            Ears- Hearing, canals-normal            Nose- Clear, no-Septal dev, mucus, polyps, erosion,  perforation             Throat- Mallampati IV , mucosa clear , drainage- none, tonsils- atrophic (+ tongue and lip studs) Neck- flexible , trachea midline, no stridor , thyroid nl, carotid no bruit Chest - symmetrical excursion , unlabored           Heart/CV- RRR , no murmur , no gallop  , no rub, nl s1 s2                           - JVD- none , edema- none, stasis changes- none, varices- none           Lung- clear to P&A, wheeze- none, cough- none , dullness-none, rub- none           Chest wall-  Abd-  Br/ Gen/ Rectal- Not done, not indicated Extrem- cyanosis- none, clubbing, none, atrophy- none, strength- nl Neuro- grossly intact to observation

## 2023-06-02 ENCOUNTER — Encounter: Payer: Self-pay | Admitting: Internal Medicine

## 2023-06-02 ENCOUNTER — Ambulatory Visit: Payer: Medicaid Other | Admitting: Internal Medicine

## 2023-06-02 VITALS — BP 120/62 | HR 101 | Temp 98.7°F | Ht 62.0 in | Wt 276.6 lb

## 2023-06-02 DIAGNOSIS — G47419 Narcolepsy without cataplexy: Secondary | ICD-10-CM | POA: Diagnosis not present

## 2023-06-02 DIAGNOSIS — Z720 Tobacco use: Secondary | ICD-10-CM

## 2023-06-02 DIAGNOSIS — G4733 Obstructive sleep apnea (adult) (pediatric): Secondary | ICD-10-CM | POA: Diagnosis not present

## 2023-06-02 NOTE — Patient Instructions (Signed)
Order- schedule Split night sleep study at sleep disorders center- [facility]  Please call us for results and recommendations about  weeks after your sleep test

## 2023-06-02 NOTE — Assessment & Plan Note (Signed)
She is too Tiffany Allen to be so heavy. PCP to work with her.

## 2023-06-02 NOTE — Assessment & Plan Note (Signed)
History and body habitus favor OSA over narcolepsy at this point. Good sleep habits emphasized. Plan- sleep study

## 2023-06-02 NOTE — Assessment & Plan Note (Signed)
Focus on smoking cessation

## 2023-06-02 NOTE — Assessment & Plan Note (Signed)
We can reconsider MSLT and stimulant meds in future

## 2023-07-20 ENCOUNTER — Other Ambulatory Visit: Payer: Self-pay

## 2023-07-24 ENCOUNTER — Other Ambulatory Visit: Payer: Self-pay | Admitting: Family Medicine

## 2023-07-24 DIAGNOSIS — L732 Hidradenitis suppurativa: Secondary | ICD-10-CM

## 2023-08-02 ENCOUNTER — Ambulatory Visit (HOSPITAL_BASED_OUTPATIENT_CLINIC_OR_DEPARTMENT_OTHER): Payer: Medicaid Other | Admitting: Internal Medicine

## 2023-08-14 ENCOUNTER — Encounter: Payer: Self-pay | Admitting: Student

## 2023-08-14 ENCOUNTER — Ambulatory Visit: Payer: Medicaid Other

## 2023-08-14 VITALS — BP 122/82 | HR 99 | Ht 62.0 in | Wt 283.1 lb

## 2023-08-14 DIAGNOSIS — R55 Syncope and collapse: Secondary | ICD-10-CM

## 2023-08-14 LAB — POCT GLYCOSYLATED HEMOGLOBIN (HGB A1C): Hemoglobin A1C: 5.9 % — AB (ref 4.0–5.6)

## 2023-08-14 NOTE — Patient Instructions (Addendum)
 It was great to see you! Thank you for allowing me to participate in your care!   I recommend that you always bring your medications to each appointment as this makes it easy to ensure we are on the correct medications and helps [place] not miss when refills are needed.  Our plans for today:  -We are checking some lab work today.  We will contact you with results of lab work.  If anything is concerning, we will discuss further. -If you develop severe shortness of breath, have an episode of losing consciousness, or experience severe chest pain please go to the emergency room.  Take care and seek immediate care sooner if you develop any concerns. Please remember to show up 15 minutes before your scheduled appointment time!  Tiffany Kocher, DO [facility]

## 2023-08-14 NOTE — Progress Notes (Signed)
    SUBJECTIVE:   CHIEF COMPLAINT / HPI:   Presyncope Patient reports that she was seen by the urgent care on 08/07/2023 for bronchitis.  She was prescribed azithromycin and prednisone.  Unable to review chest x-ray, however impression from urgent care note stated possible viral bronchitis, no concern for consolidation. Since stopping her steroids 4 days ago, patient reports that she has had episodes of presyncope.  They last in undetermined amount of time.  Only improves with time.  Episodes occur at random, patient can be sitting down.  Patient reports that her blood pressure has been up and down as well as elevated fasting blood sugars since starting steroids-we discussed that this can be normal with prednisone.  Additionally, patient still concerned about cough.  She is a smoker, half a pack to pack per day-we discussed cessation.  Patient expressed concern over blood clots greater than 10 years ago, she had a blood clot in her leg after taking OCPs and has not taken anticoagulation-unfortunately this is not documented in her chart.  At the moment she denies chest pain, shortness of breath, headaches, fevers, NVD, tinnitus, hearing loss. She is currently taking amlodipine, but is not noticed any low blood pressures.  Additionally, episodes do not occur from sitting to standing.   OBJECTIVE:   BP (!) 131/91   Pulse 99   Ht 5\' 2"  (1.575 m)   Wt 283 lb 2 oz (128.4 kg)   LMP 08/04/2023   SpO2 99%   BMI 51.78 kg/m    General: NAD, pleasant Cardio: RRR, no MRG. Cap Refill <2s. Respiratory: CTAB, normal wob on RA Skin: Warm and dry Neuro: Alert and oriented x 4, no overt focal neurologic deficit  EKG: Sinus arrhthymia, normal rate, no ACS. Question accuracy given morbid obesity.   ASSESSMENT/PLAN:   Assessment & Plan Pre-syncope Differential includes: Arrhythmia, anemia, hypoglycemia, electrolyte disorder, dehydration, medication reaction (discontinuation of prednisone). Low suspicion  for PE, however does have distant history of DVT. Low concern for: ACS (no chest pain), seizure (no seizure-like activity). -BMP, CBC, hemoglobin A1c, D-dimer - Sinus arrhythmia, could consider Zio patch.   -Follow-up with PCP 08/24/2023  Tiffany Kocher, DO [facility]

## 2023-08-15 LAB — CBC
Hematocrit: 47.1 % — ABNORMAL HIGH (ref 34.0–46.6)
Hemoglobin: 15.4 g/dL (ref 11.1–15.9)
MCH: 28.1 pg (ref 26.6–33.0)
MCHC: 32.7 g/dL (ref 31.5–35.7)
MCV: 86 fL (ref 79–97)
Platelets: 291 10*3/uL (ref 150–450)
RBC: 5.48 x10E6/uL — ABNORMAL HIGH (ref 3.77–5.28)
RDW: 13.6 % (ref 11.7–15.4)
WBC: 13.6 10*3/uL — ABNORMAL HIGH (ref 3.4–10.8)

## 2023-08-15 LAB — BASIC METABOLIC PANEL
BUN/Creatinine Ratio: 20 (ref 9–23)
BUN: 18 mg/dL (ref 6–20)
CO2: 21 mmol/L (ref 20–29)
Calcium: 9.1 mg/dL (ref 8.7–10.2)
Chloride: 106 mmol/L (ref 96–106)
Creatinine, Ser: 0.9 mg/dL (ref 0.57–1.00)
Glucose: 72 mg/dL (ref 70–99)
Potassium: 4.5 mmol/L (ref 3.5–5.2)
Sodium: 140 mmol/L (ref 134–144)
eGFR: 84 mL/min/{1.73_m2} (ref >59)

## 2023-08-15 LAB — D-DIMER, QUANTITATIVE: D-DIMER: 0.39 mg{FEU}/L (ref 0.00–0.49)

## 2023-08-24 ENCOUNTER — Ambulatory Visit (INDEPENDENT_AMBULATORY_CARE_PROVIDER_SITE_OTHER): Payer: Medicaid Other | Admitting: Family Medicine

## 2023-08-24 VITALS — BP 114/75 | HR 76 | Ht 62.0 in | Wt 279.4 lb

## 2023-08-24 DIAGNOSIS — R7303 Prediabetes: Secondary | ICD-10-CM | POA: Insufficient documentation

## 2023-08-24 DIAGNOSIS — I10 Essential (primary) hypertension: Secondary | ICD-10-CM | POA: Diagnosis not present

## 2023-08-24 DIAGNOSIS — F339 Major depressive disorder, recurrent, unspecified: Secondary | ICD-10-CM | POA: Diagnosis not present

## 2023-08-24 DIAGNOSIS — L732 Hidradenitis suppurativa: Secondary | ICD-10-CM

## 2023-08-24 MED ORDER — DOXYCYCLINE HYCLATE 100 MG PO TABS
100.0000 mg | ORAL_TABLET | Freq: Every day | ORAL | 0 refills | Status: DC
Start: 2023-08-24 — End: 2023-11-02

## 2023-08-24 MED ORDER — METFORMIN HCL ER 500 MG PO TB24: 1000.0000 mg | ORAL_TABLET | Freq: Two times a day (BID) | ORAL | 3 refills | Status: DC

## 2023-08-24 NOTE — Assessment & Plan Note (Signed)
 A1c 5.9.  Increasing metformin as above since she is on this predominantly for HS.  Continue lifestyle management.  Follow-up A1c in 3 months.

## 2023-08-24 NOTE — Assessment & Plan Note (Signed)
 At goal with amlodipine 10 mg daily.  Continue current management.

## 2023-08-24 NOTE — Patient Instructions (Signed)
 I have increased your metformin to 1000 mg twice daily. You can take the tablets you have one in the AM and one in the PM. I will refill when you are out. Let me know!  Come back in 1 month for follow up of HS. I have sent in the doxycycline daily. Take this with the metformin. I will call the dermatologist.  Continue your wellbutrin and therapy. Let me know if you have any issues or problems before our next visit.  As always -- it was wonderful to see you!

## 2023-08-24 NOTE — Assessment & Plan Note (Addendum)
 Unfortunately continue to flare.  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 Given prediabetes and with shared decision making, will increase metformin to 1000 mg twice daily.  Also sent in repeat prescription for doxycycline 100 mg daily for prophylaxis.  I will get in touch with our referral coordinator to see if we can get her in sooner with dermatology for further evaluation.  Follow-up in 1 month or sooner if needed.

## 2023-08-24 NOTE — Progress Notes (Signed)
    SUBJECTIVE:   CHIEF COMPLAINT / HPI:   Hidradenitis suppurativa Continues to take metformin 1000 mg daily.  Unfortunately was not able to pick up doxycycline to take daily.  She has been having flares more consistently recently as well.  She brings in multiple photos that she takes that demonstrates multiple nodules along the breast and other intertriginous areas.  Prediabetes Recently had A1c checked which was 5.9 up from 5.5 a few months back.  She is curious if she has diabetes and what she can do about this.  Depression Continues to still feel down and have passive thoughts of suicide, however, her Wellbutrin and therapy sessions have been very helpful.  She denies active SI today and would like to continue with her current regimen.  OBJECTIVE:   BP 114/75   Pulse 76   Ht 5\' 2"  (1.575 m)   Wt 279 lb 6.4 oz (126.7 kg)   LMP 08/04/2023   SpO2 94%   BMI 51.10 kg/m   General: Alert and oriented, in NAD HEENT: NCAT, EOM grossly normal, midline nasal septum Cardiac: RRR, no m/r/g appreciated Respiratory: CTAB, breathing and speaking comfortably on RA Abdominal: Soft, nontender, nondistended, normoactive bowel sounds Extremities: Moves all extremities grossly equally Neurological: No gross focal deficit Psychiatric: Appropriate mood and affect, PHQ-9 decreased today to 13 with positive #9  ASSESSMENT/PLAN:   Essential hypertension At goal with amlodipine 10 mg daily.  Continue current management.  Hidradenitis suppurativa Unfortunately continue to flare.  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 Given prediabetes and with shared decision making, will increase metformin to 1000 mg twice daily.  Also sent in repeat prescription for doxycycline 100 mg daily for prophylaxis.  I will get in touch with our referral coordinator to see if we can get her in sooner with dermatology for further evaluation.  Follow-up in 1 month or sooner if needed.  Prediabetes A1c  5.9.  Increasing metformin as above since she is on this predominantly for HS.  Continue lifestyle management.  Follow-up A1c in 3 months.  Major depression, recurrent, chronic (HCC) PHQ-9 overall improved from prior with therapy and medication.  Reassuringly no active SI today.  Patient notes she has a good support system and is due to go to because she is in crisis.   Janeal Holmes, MD [facility]

## 2023-08-24 NOTE — Assessment & Plan Note (Signed)
 PHQ-9 overall improved from prior with therapy and medication.  Reassuringly no active SI today.  Patient notes she has a good support system and is due to go to because she is in crisis.

## 2023-09-30 ENCOUNTER — Ambulatory Visit (HOSPITAL_BASED_OUTPATIENT_CLINIC_OR_DEPARTMENT_OTHER): Payer: Medicaid Other | Attending: Internal Medicine | Admitting: Internal Medicine

## 2023-09-30 DIAGNOSIS — G479 Sleep disorder, unspecified: Secondary | ICD-10-CM | POA: Insufficient documentation

## 2023-09-30 DIAGNOSIS — G4733 Obstructive sleep apnea (adult) (pediatric): Secondary | ICD-10-CM

## 2023-10-11 DIAGNOSIS — G4733 Obstructive sleep apnea (adult) (pediatric): Secondary | ICD-10-CM | POA: Diagnosis not present

## 2023-10-11 NOTE — Procedures (Signed)
 [facility] [address] [place], [place] [address] Tel: [phone]   Fax: [phone]  Polysomnography Interpretation  Patient Name:  Tiffany Allen, Tiffany Allen Date:  09/30/2023 Referring Physician:  Dr. Rupert Counts D. Dmitri Pettigrew  Indications for Polysomnography The patient is a 38 year old Female who is 5\' 2"  and weighs 278.0 lbs. Her BMI equals 51.2.  A full night polysomnogram was performed to evaluate for -OSA  Medication taken at 2126.  AMLODIPINE    Polysomnogram Data A full night polysomnogram recorded the standard physiologic parameters including EEG, EOG, EMG, EKG, nasal and oral airflow.  Respiratory parameters of chest and abdominal movements were recorded with Respiratory Inductance Plethysmography belts.  Oxygen saturation was recorded by pulse oximetry.   Sleep Architecture The total recording time of the polysomnogram was 458.2 minutes.  The total sleep time was 229.5 minutes.  The patient spent 44.0% of total sleep time in Stage N1, 50.5% in Stage N2, 0.7% in Stages N3, and 4.8% in REM.  Sleep latency was 7.0 minutes.  REM latency was 61.0 minutes.  Sleep Efficiency was 50.1%.  Wake after Sleep Onset time was 221.5 minutes.  Respiratory Events The polysomnogram revealed a presence of - obstructive, 1 central, and - mixed apneas resulting in an Apnea index of 0.3 events per hour.  There were 16 hypopneas (>=3% desaturation and/or arousal) resulting in an Apnea\Hypopnea Index (AHI >=3% desaturation and/or arousal) of 4.4 events per hour.  There were 3 hypopneas (>=4% desaturation) resulting in an Apnea\Hypopnea Index (AHI >=4% desaturation) of 1.0 events per hour.  There were 16 Respiratory Effort Related Arousals resulting in a RERA index of 4.2 events per hour. The Respiratory Disturbance Index is 8.6 events per hour.  The snore index was - events per hour.  Mean oxygen saturation was 95.0%.  The lowest oxygen saturation during sleep was  90.0%.  Time spent <=88% oxygen saturation was - minutes (-).  Limb Activity There were - total limb movements recorded, of this total, - were classified as PLMs.  PLM index was - per hour and PLM associated with Arousals index was - per hour.  Cardiac Summary The average pulse rate was 78.4 bpm.  The minimum pulse rate was 57.0 bpm while the maximum pulse rate was 110.0 bpm.  Cardiac rhythm was normal. P waves not well defined, but rhythm appeared to be RSR.  Comment: Occasional apneas and hypopneas, within normal limits, AHI (4%) 1/hr. Mild snoring with oxygen desaturation to a nadir of 90%, mean 95%. Sleep architecture was markedly fragmented with frequent spontaneous arousals and awakenings. REM reduced. No significant parasomnias.  Diagnosis: Other sleep disorder  Recommendations: Consider if management of an insomnia component would be clinically appropriate.   This study was personally reviewed and electronically signed by: Dr. Tereso Fenton. Kishia Shackett Academic librarian in Sleep Medicine Date/Time: 10/11/23  1:14        Diagnostic PSG Report  Patient Name: Tiffany Allen, Tiffany Allen Date: 09/30/2023  Date of Birth: 1986-05-30 Study Type: Split Night  Age: 26 year MRN #: [id number]  Sex: Female Interpreting Physician: Rosa College [id number]  Height: 5\' 2"  Referring Physician: Dr. Tereso Fenton. Roseline Ebarb  Weight: 278.0 lbs Recording Tech: Charlsie Cool CRT RPSGT RST  BMI: 51.2 Scoring Tech: Charlsie Cool CRT RPSGT RST  ESS: 23 Neck Size: 16.25   Study Overview  Lights Off: 09:44:28 PM  Count Index  Lights On: 05:22:41 AM Awakenings: 69 18.0  Time in Bed: 458.2 min. Arousals: 145 37.9  Total Sleep Time: 229.5 min. AHI (>=3% Desat and/or Ar.): 17 4.4   Sleep Efficiency: 50.1% AHI (>=4% Desat): 4 1.0   Sleep Latency: 7.0 min. Limb Movements: - -  Wake After Sleep Onset: 221.5 min. Snore: - -  REM Latency from Sleep Onset: 61.0 min. Desaturations: 50 13.1     Minimum SpO2 TST:  90.0%    Sleep Architecture  % of Time in Bed Stages Time (mins) % Sleep Time  Wake 229.0   Stage N1 101.0 44.0%  Stage N2 116.0 50.5%  Stage N3 1.5 0.7%  REM 11.0 4.8%   Arousal Summary   NREM REM Sleep Index  Respiratory Arousals 14 - 14 3.7  PLM Arousals - - - -  Isolated Limb Movement Arousals - - - -  Snore Arousals - - - -  Spontaneous Arousals 128 3 131 34.2  Total 142 3 145 37.9   Limb Movement Summary   Count Index  Isolated Limb Movements - -  Periodic Limb Movements (PLMs) - -  Total Limb Movements - -    Respiratory Summary   By Sleep Stage By Body Position Total   NREM REM Supine Non-Supine   Time (min) 218.5 11.0 142.5 87.0 229.5         Obstructive Apnea - - - - -  Mixed Apnea - - - - -  Central Apnea - 1 1 - 1  Total Apneas - 1 1 - 1  Total Apnea Index - 5.5 0.4 - 0.3         Hypopneas (>=3% Desat and/or Ar.) 11 5 10 6 16   AHI (>=3% Desat and/or Ar.) 3.0 32.7 4.6 4.1 4.4         Hypopneas (>=4% Desat) 2 1 3  - 3  AHI (>=4% Desat) 0.5 10.9 1.7 - 1.0          RERAs 16 - 11 5 16   RERA Index 4.4 - 4.6 3.4 4.2         RDI 7.4 32.7 9.3 7.6 8.6    Respiratory Event Type Index  Central Apneas 0.3  Obstructive Apneas -  Mixed Apneas -  Central Hypopneas -  Obstructive Hypopneas -  Central Apnea + Hypopnea (CAHI) 0.3  Obstructive Apnea + Hypopnea (OAHI) 4.2   Respiratory Event Durations   Apnea Hypopnea   NREM REM NREM REM  Average (seconds) - 11.6 17.0 14.8  Maximum (seconds) - 11.6 28.2 20.8    Oxygen Saturation Summary   Wake NREM REM TST TIB  Average SpO2 (%) 95.4% 94.6% 95.7% 94.7% 95.0%  Minimum SpO2 (%) 88.0% 90.0% 91.0% 90.0% 88.0%  Maximum SpO2 (%) 100.0% 99.0% 98.0% 99.0% 100.0%   Oxygen Saturation Distribution  Range (%) Time in range (min) Time in range (%)  90.0 - 100.0 440.6 99.9%  80.0 - 90.0 0.3 0.1%  70.0 - 80.0 - -  60.0 - 70.0 - -  50.0 - 60.0 - -  0.0 - 50.0 - -  Time Spent <=88% SpO2  Range (%) Time in  range (min) Time in range (%)  0.0 - 88.0 0.0 0.0%      Count Index  Desaturations 50 13.1    Cardiac Summary   Wake NREM REM Sleep Total  Average Pulse Rate (BPM) 80.6 75.8 86.5 76.3 78.4  Minimum Pulse Rate (BPM) 57.0 60.0 73.0 60.0 57.0  Maximum Pulse Rate (BPM) 110.0 93.0 97.0 97.0 110.0   Pulse Rate Distribution:  Range (bpm) Time in range (  min) Time in range (%)  0.0 - 40.0 - -  40.0 - 60.0 0.2 0.0%  60.0 - 80.0 248.6 56.3%  80.0 - 100.0 182.5 41.4%  100.0 - 120.0 1.6 0.4%  120.0 - 140.0 - -  140.0 - 200.0 - -   EtCO2 Summary  Stage Min (mmHg) Average (mmHg) Max (mmHg)  Wake - - -  NREM(1+2+3) - - -  REM - - -   EtCO2 Distribution:  Range (mmHg) Time in range (min) Time in range (%)  20.0 - 40.0 - -  40.0 - 50.0 - -  50.0 - 100.0 - -  55.0 - 100.0 - -  Excluded data <20.0 & >65.0 459.0 100.0%     Hypnograms                         Technologist Comments  THE 7-YEAR-OLD FEMALE PATIENT PRESENTED TO THE SLEEP DISORDER CENTER FOR A SPLIT NIGHT STUDY WITH A CHIEF COMPLAINT OF OSA. ALL BEDTIME MEDICATIONS WERE SELF ADMINISTERED AT 2126. THE PATIENT WAS FITTED WITH A SMALL/ MEDIUM RESMED AIRFIT F40 FULL FACE MASK FOR PRE-CPAP TRIAL, WHICH WAS TOLERATED WELL. THE LEAD PLACEMENT WAS INITIATED, THEN THE STUDY WAS BEGUN. SUPPLEMENTAL OXYGEN WAS NOT WARRANTED DURING THE STUDY. MILD SNORING WAS NOTED THROUGHOUT THE STUDY. NO PLMs - PLMAs WERE NOTED. QUESTIONABLE ATRIAL FIBRILLATION WAS NOTED, BUT NO ADDITONAL CARDIAC ARRHYTHMIAS WERE OBSERVED. PLEASE SEE ATTACHED SHEETS. TWO RESTROOM VISITS WERE MADE. NO OBVIOUS PARASOMNIAS WERE OBSERVED. SPLIT NIGHT CRITERIA WAS NOT MET DUE TO INSUFFICIENT AHI, THEREFORE A NPSG DIAGNOSTIC STUDY WAS CONTINUED. THE PATIENT TOLERATED THE NPSG DIAGNOSTIC STUDY WELL. -TECH                          Shayley Medlin Garret Kales, Biomedical engineer of Sleep Medicine  ELECTRONICALLY SIGNED ON:   10/11/2023, 1:06 PM [facility] PH: [phone]   FX: [id number] ACCREDITED BY THE [employer] OF SLEEP [employer]ICINE

## 2023-11-02 ENCOUNTER — Encounter: Payer: Self-pay | Admitting: Family Medicine

## 2023-11-02 ENCOUNTER — Ambulatory Visit: Admitting: Family Medicine

## 2023-11-02 VITALS — BP 137/97 | HR 84 | Ht 62.0 in | Wt 283.0 lb

## 2023-11-02 DIAGNOSIS — F339 Major depressive disorder, recurrent, unspecified: Secondary | ICD-10-CM

## 2023-11-02 DIAGNOSIS — N6320 Unspecified lump in the left breast, unspecified quadrant: Secondary | ICD-10-CM | POA: Diagnosis not present

## 2023-11-02 DIAGNOSIS — I10 Essential (primary) hypertension: Secondary | ICD-10-CM | POA: Diagnosis not present

## 2023-11-02 DIAGNOSIS — L732 Hidradenitis suppurativa: Secondary | ICD-10-CM

## 2023-11-02 MED ORDER — METFORMIN HCL ER 500 MG PO TB24: 1000.0000 mg | ORAL_TABLET | Freq: Two times a day (BID) | ORAL | 3 refills | Status: AC

## 2023-11-02 MED ORDER — BUPROPION HCL ER (XL) 150 MG PO TB24: ORAL_TABLET | ORAL | 0 refills | Status: AC

## 2023-11-02 MED ORDER — AMLODIPINE BESYLATE 10 MG PO TABS: 10.0000 mg | ORAL_TABLET | Freq: Every day | ORAL | 0 refills | Status: AC

## 2023-11-02 MED ORDER — DOXYCYCLINE HYCLATE 100 MG PO TABS: 100.0000 mg | ORAL_TABLET | Freq: Every day | ORAL | 0 refills | Status: AC

## 2023-11-02 NOTE — Assessment & Plan Note (Signed)
 With overlying grief reaction.  Reassuringly without SI.  Discussed how continuing to eat, stay hydrated, and at that, Tiffany Allen can help her best to the situation.  She will follow-up with therapy tomorrow.  Follow-up with me in 2 months or sooner if needed; I let her know that I am here to help however I can.

## 2023-11-02 NOTE — Assessment & Plan Note (Signed)
 Unsure if patient was able to follow-up with surgery about this lesion based on recommendations from ultrasound to be seen by surgical/dermatologic specialist.  She does have dermatology appointment scheduled in August.

## 2023-11-02 NOTE — Assessment & Plan Note (Signed)
 Addition of daily doxycycline  appears to have improved.  Continue metformin  and doxycycline  given benefit; counseled importance of taking medication to reduce flares.  She has dermatology appointment in August for further evaluation.

## 2023-11-02 NOTE — Progress Notes (Signed)
    SUBJECTIVE:   CHIEF COMPLAINT / HPI:   Hydradenitis suppurativa Previously increase metformin  to 1000 mg twice daily and added doxycycline  100 mg daily for prophylaxis.  She was taking these before the death of her girlfriend recently.  She has only had 1 flare while on a daily doxycycline .  Obstructive sleep apnea Previously diagnosed in early 2000's.  Most recent sleep study with AHI 4% and overall within normal limits, feel insomnia could be contributing more than OSA at this time.  Grief, MDD Girlfriend of 16 years passed away [place].  This has been very hard for her.  She has been missing her medications and unable to take her Wellbutrin  due to her status.  She denies SI.  Hypertension Unfortunately has not been to take her medicines as above due to her grief.  PERTINENT  PMH / PSH: PCOS, RLS  OBJECTIVE:   BP (!) 137/97   Pulse 84   Ht 5\' 2"  (1.575 m)   Wt 283 lb (128.4 kg)   LMP 09/25/2023   SpO2 100%   BMI 51.76 kg/m   General: Alert and oriented, in NAD Skin: Warm, dry, and intact without lesions HEENT: NCAT, EOM grossly normal, midline nasal septum Cardiac: RRR, no m/r/g appreciated Respiratory: CTAB, breathing and speaking comfortably on RA Abdominal: Soft, nontender, nondistended, normoactive bowel sounds Extremities: Moves all extremities grossly equally Neurological: No gross focal deficit Psychiatric: Appropriate mood and affect   ASSESSMENT/PLAN:   Assessment & Plan Hidradenitis suppurativa Addition of daily doxycycline  appears to have improved.  Continue metformin  and doxycycline  given benefit; counseled importance of taking medication to reduce flares.  She has dermatology appointment in August for further evaluation. Essential hypertension Elevated in the setting of not taking amlodipine .  Counseled on importance of taking medication.  Recheck at next visit. Major depression, recurrent, chronic (HCC) With overlying grief reaction.   Reassuringly without SI.  Discussed how continuing to eat, stay hydrated, and at that, Saverio Curling can help her best to the situation.  She will follow-up with therapy tomorrow.  Follow-up with me in 2 months or sooner if needed; I let her know that I am here to help however I can. Mass of left breast, unspecified quadrant Unsure if patient was able to follow-up with surgery about this lesion based on recommendations from ultrasound to be seen by surgical/dermatologic specialist.  She does have dermatology appointment scheduled in August.   Genetta Kenning, MD [facility]

## 2023-11-02 NOTE — Assessment & Plan Note (Signed)
 Elevated in the setting of not taking amlodipine .  Counseled on importance of taking medication.  Recheck at next visit.

## 2023-11-02 NOTE — Patient Instructions (Signed)
 I sent in your medications.  Come back in 2 months to check on mood and blood pressure or sooner if you need me.  I am so so sorry for your loss. I am always here when you need me. I am so happy that you have your therapist to chat with tomorrow, too.

## 2023-12-03 ENCOUNTER — Telehealth: Payer: Self-pay

## 2023-12-03 NOTE — Telephone Encounter (Unsigned)
 Copied from CRM [id number]. Topic: Clinical - Lab/Test Results >> Dec 02, 2023  2:38 PM Dyann Glaser G wrote: Reason for CRM: PT CALLED IN STATED SHE HAD A SLEEP STUDY IN APRIL AND NO ONE HAS CONTACTED HER ABOUT THE RESULTS. SHOWING SHE HAD THIS DONE AT [facility]. SHOWING ON THE NOTES DATED 09/30/2023

## 2023-12-04 NOTE — Telephone Encounter (Signed)
 Called patient.  Patient wants to come in to review her sleep study as she states she has been diagnosed with OSA since the 5th grade.  Per Dr. Linder Revere, have patient either see him or patient can see another sleep doctor for a second opinion.  Called patient back.  Patient will wait until Monday 12/07/2023 to get scheduled to review her sleep study results.  Will forward to front desk for scheduling.

## 2023-12-04 NOTE — Telephone Encounter (Signed)
 The sleep study did not show sleep apnea and the oxygen levels were normal. You had trouble falling asleep and staying asleep. This could just have been because you were in an unfamiliar situation. If this was what your sleep is like at home, then it may be worth while treating you with a sleeping pill for Insomnia.

## 2023-12-07 NOTE — Telephone Encounter (Signed)
 Can I add patient to RNA slot? If not I will schedule patient for August, please advise, thanks!

## 2023-12-07 NOTE — Telephone Encounter (Signed)
 Patient scheduled 6/26 with Dr.Young.

## 2023-12-09 NOTE — Progress Notes (Unsigned)
 Patient ID: KIRRA VERGA, female   DOB: Oct 04, 1985, 38 y.o.   MRN: [id number]  01/26/17- 38 year old female for sleep evaluation. 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  FOLLOWS FOR: Former [facility] patient for Narcolepsy as well. Had sleep study in past and had CPAP through [facility]-could not tolerate. Had been on Provigil  in past as well and declined using Ritalin  rx.  Epworth 22/24 Medical problem list includes obstructive sleep apnea, restless legs syndrome, narcolepsy without cataplexy NPSG 01/29/06- AHI 9/ hr, desaturation to 81%, + PLMs with arousal MSLT 1//5/04 - 2.4 minutes with 5 SOREMS Had seen Dr. Corrie in 2013- Nuvigil seemed to cause loose stools. She was concerned about potential for cardiovascular side effects possible with Ritalin . At that time she was using CPAP but says she stopped because she didn't like the mask fit. Could not afford Requip . She has not been on treatment in several years. Apparently she has just been cut off food stamps and subsidized housing, precipitating visit today. She asks a letter saying she is under our care. Her primary complaint is variable daytime sleepiness, drifting between alertness and sleepiness throughout day and night. Bedtime around 10 PM, 30 minutes sleep latency, waking at least 5 times before up at 11 AM. ENT surgery-none She doesn't recognize much sleep disturbance related to limb movement now and denies symptoms of cataplexy. She does admit occasional definite sleep paralysis.  04/30/17- 31 yoF smoker followed for Narcolepsy compicated by obesity --Pt is no bettter since last visit 3 months ago, patient is not sleeping at all each night U difficulty maintaining wakefulness in the daytime and sleep at night.  No cataplexy.  Viewed her sleep study, confirming absence of significant apnea at night.  Time study confirmed severe somnolence consistent with narcolepsy. NPSG 03/03/17-  AHI 0.5/ hr, desaturation to a nadir  of88%.281 lbs. MSLT- 03/04/17  Mean latency 0:04 minutes, slept 5/5 naps, SOREM 2/5 naps She is firmly against trying Ritalin  or Adderall because she thinks Dr Corrie she would become dependent on these and that they had other unacceptable side effects.  Education effort made. She is willing to try modafinil . ================================================================== 06/02/23- 37 yoF Smoker coming to re-establish with Narcolepsy, complicated by Morbid Obesity, Hidradenitis, HTN, Tobacco Use,  Epworth score 23 Body weight today- 276 lbs -----Snoring, waking at night gasping for air.  Sx x several years.  Est pt of Dr. Corrie.  Last used CPAP over 5 years ago. Discussed the use of AI scribe software for clinical note transcription with the patient, who gave verbal consent to proceed.  History of Present Illness   The patient, with a history of narcolepsy, presents with ongoing sleep disturbances. She reports difficulty falling asleep, tossing and turning for hours, and only achieving rest around 4:30-5:00 AM despite attempting to sleep at 9:30 PM. She denies using any sleep aids. She reports snoring, waking herself up at times, and frequent nocturia (3-4 times/night). She naps daily from 3:00-5:00 PM. She denies using any stimulants or caffeine to stay awake during the day. She reports a recent increase in appetite, particularly late at night (2:30-3:00 AM). She has a family history of sleep disorders, with her father having had insomnia. She has a history of smoking, currently at a pack a day, down from two packs a day.    Assessment and Plan:    Suspected Sleep Apnea Reports snoring, nocturia, and daytime sleepiness. Previous sleep study in 2018 showed minimal sleep apnea but significant daytime sleepiness suggestive  of narcolepsy. Patient has not been on any sleep aids or stimulants. -Schedule a new sleep study to assess for changes in sleep apnea severity. -Consider CPAP therapy or  alternative treatments based on the severity of sleep apnea.  Narcolepsy Daytime sleepiness with napping from 3-5pm daily. No current stimulant use. She refused stimulant meds when considered in past. -Consider stimulant therapy based on sleep study results and daytime sleepiness. Emphasize naps and good sleep hygiene.  Tobacco Use Reports smoking 1 pack per day, down from 2 packs per day. -Encourage continued smoking cessation efforts.  Follow-up Call the office for sleep study results if not contacted within 2 weeks after the study.     12/10/23- 06/02/23- 38 yoF Smoker followed for Narcolepsy, complicated by Morbid Obesity, Hidradenitis, HTN, Tobacco Use, NPSG 09/30/23- AHI 1/ hr, desat to 90%, body weight 278 lbs, fragmented/ frequent WASO Body weight today-    ROS-see HPI  + = positive Constitutional:    weight loss, night sweats, fevers, chills, + fatigue, lassitude. HEENT:    headaches, difficulty swallowing, tooth/dental problems, sore throat,       sneezing, itching, ear ache, nasal congestion, post nasal drip, snoring CV:    chest pain, orthopnea, PND, swelling in lower extremities, anasarca,                                                         dizziness, palpitations Resp:   shortness of breath with exertion or at rest.                productive cough,   non-productive cough, coughing up of blood.              change in color of mucus.  wheezing.   Skin:    rash or lesions. GI:  No-   heartburn, indigestion, abdominal pain, nausea, vomiting, diarrhea,                 change in bowel habits, loss of appetite GU: dysuria, change in color of urine, no urgency or frequency.   flank pain. MS:   joint pain, stiffness, decreased range of motion, back pain. Neuro-     nothing unusual Psych:  change in mood or affect.  depression or anxiety.   memory loss.  OBJ- Physical Exam General- Alert, Oriented, +drowsy/passive, Distress- none acute, + morbidly obese Skin- rash-none,  lesions- none, excoriation- none Lymphadenopathy- none Head- atraumatic            Eyes- Gross vision intact, PERRLA, conjunctivae and secretions clear            Ears- Hearing, canals-normal            Nose- Clear, no-Septal dev, mucus, polyps, erosion, perforation             Throat- Mallampati III-IV , mucosa clear , drainage- none, tonsils- atrophic (+ tongue and lip studs) Neck- flexible , trachea midline, no stridor , thyroid nl, carotid no bruit Chest - symmetrical excursion , unlabored           Heart/CV- RRR , no murmur , no gallop  , no rub, nl s1 s2                           - JVD-  none , edema- none, stasis changes- none, varices- none           Lung- clear to P&A, wheeze- none, cough- none , dullness-none, rub- none           Chest wall-  Abd-  Br/ Gen/ Rectal- Not done, not indicated Extrem- cyanosis- none, clubbing, none, atrophy- none, strength- nl Neuro- grossly intact to observation

## 2023-12-10 ENCOUNTER — Ambulatory Visit: Admitting: Internal Medicine

## 2023-12-10 ENCOUNTER — Encounter: Payer: Self-pay | Admitting: Internal Medicine

## 2023-12-10 VITALS — BP 134/88 | HR 91 | Temp 98.1°F | Ht 62.0 in | Wt 287.2 lb

## 2023-12-10 DIAGNOSIS — G47419 Narcolepsy without cataplexy: Secondary | ICD-10-CM

## 2023-12-10 MED ORDER — MODAFINIL 100 MG PO TABS: ORAL_TABLET | ORAL | 5 refills | Status: DC

## 2023-12-10 NOTE — Patient Instructions (Signed)
 Script sent to try modafinil  1 or 2 tabs daily for alertness  Please call as needed  Don't drive if sleepy  Nap when you can

## 2023-12-11 ENCOUNTER — Telehealth: Payer: Self-pay

## 2023-12-11 NOTE — Telephone Encounter (Signed)
*  Pulm  Pharmacy Patient Advocate Encounter   Received notification from Fax that prior authorization for Provigil  100MG  tablets  is required/requested.   Insurance verification completed.   The patient is insured through [facility] MEDICAID .   Per test claim: PA required; PA submitted to above mentioned insurance via CoverMyMeds Key/confirmation #/EOC [id number] Status is pending

## 2023-12-11 NOTE — Telephone Encounter (Signed)
 Per your health plan's criteria, more than one tablet per day is covered if you meet the following: The dose cannot be achieved with the quantity limit (plan accepted quantity limit of a different dose or formulation). The information provided does not show that you meet the criteria listed above. Please speak with your doctor about your choices. This decision was made per the [facility] Quantity Limits Guideline. **Please note: Provigil  tablet (all strengths) has been approved within the plan's quantity limit through 12/10/2024. Please note: Provigil  200mg  tablet is available and will process at the local pharmacy for up to one tablet per day. Please consider switching to this available dose. This drug can be split in half to make the requested dosing

## 2023-12-14 MED ORDER — MODAFINIL 200 MG PO TABS: ORAL_TABLET | ORAL | 5 refills | Status: AC

## 2023-12-14 NOTE — Telephone Encounter (Signed)
 I changed and re-sent modafinil  script as suggested. Computer says it may need prior auth.

## 2023-12-15 ENCOUNTER — Other Ambulatory Visit (HOSPITAL_COMMUNITY): Payer: Self-pay

## 2023-12-15 NOTE — Telephone Encounter (Signed)
 Test claim shows it was filled but does not mention PA being required.

## 2023-12-16 NOTE — Telephone Encounter (Signed)
 Noted. NFN.

## 2023-12-21 ENCOUNTER — Emergency Department (HOSPITAL_BASED_OUTPATIENT_CLINIC_OR_DEPARTMENT_OTHER)
Admission: EM | Admit: 2023-12-21 | Discharge: 2023-12-21 | Discharge status: Against Medical Advice | Attending: Emergency Medicine | Admitting: Emergency Medicine

## 2023-12-21 ENCOUNTER — Other Ambulatory Visit: Payer: Self-pay

## 2023-12-21 ENCOUNTER — Encounter (HOSPITAL_BASED_OUTPATIENT_CLINIC_OR_DEPARTMENT_OTHER): Payer: Self-pay | Admitting: Emergency Medicine

## 2023-12-21 DIAGNOSIS — Z5321 Procedure and treatment not carried out due to patient leaving prior to being seen by health care provider: Secondary | ICD-10-CM | POA: Diagnosis not present

## 2023-12-21 DIAGNOSIS — M545 Low back pain, unspecified: Secondary | ICD-10-CM | POA: Diagnosis present

## 2023-12-21 DIAGNOSIS — M62830 Muscle spasm of back: Secondary | ICD-10-CM | POA: Insufficient documentation

## 2023-12-21 NOTE — ED Triage Notes (Signed)
 C/o lower back spasm starting around 0400 this morning. Denies recent injuries. Hx of sciatica.

## 2023-12-21 NOTE — ED Notes (Signed)
 Patient updated on wait times and barriers preventing her getting a room. Patient understood. Vitals updated.

## 2024-01-15 ENCOUNTER — Ambulatory Visit: Admitting: Family Medicine

## 2024-01-15 VITALS — BP 135/80 | HR 80 | Wt 286.4 lb

## 2024-01-15 DIAGNOSIS — R7303 Prediabetes: Secondary | ICD-10-CM

## 2024-01-15 DIAGNOSIS — F339 Major depressive disorder, recurrent, unspecified: Secondary | ICD-10-CM

## 2024-01-15 DIAGNOSIS — L732 Hidradenitis suppurativa: Secondary | ICD-10-CM | POA: Diagnosis not present

## 2024-01-15 DIAGNOSIS — Z720 Tobacco use: Secondary | ICD-10-CM

## 2024-01-15 DIAGNOSIS — I10 Essential (primary) hypertension: Secondary | ICD-10-CM

## 2024-01-15 LAB — POCT GLYCOSYLATED HEMOGLOBIN (HGB A1C): Hemoglobin A1C: 5.6 % (ref 4.0–5.6)

## 2024-01-15 NOTE — Progress Notes (Signed)
    SUBJECTIVE:   CHIEF COMPLAINT / HPI:   Mood Going to therapy. Doing well from this perspective. Taking her bupropion .  Tobacco use No longer smoking a ppd. Now smoking just a half a pack per day.  HS Has been taking her metformin  and doxycycline . Down to a flare about every 3-4 months.  She has a follow-up with dermatology this month.  HTN Has not taken amlodipine  today.  Tolerating it well otherwise.  Narcolepsy Restarted modafinil  by sleep doctor. She states this has not helping as much as she feels it could have.  OBJECTIVE:   BP 135/80   Pulse 80   Wt 286 lb 6.4 oz (129.9 kg)   SpO2 100%   BMI 52.38 kg/m   General: Alert and oriented, in NAD Skin: Warm, dry, and intact; she shows me photos from 2 weeks ago where she had cystic lesions with sinus tract involvement underneath bilateral breasts and also in the bilateral inguinal creases HEENT: NCAT, EOM grossly normal, midline nasal septum Cardiac: RRR, no m/r/g appreciated Respiratory: CTAB, breathing and speaking comfortably on RA Extremities: Moves all extremities grossly equally Neurological: No gross focal deficit Psychiatric: Appropriate mood and affect  ASSESSMENT/PLAN:   Assessment & Plan Major depression, recurrent, chronic (HCC) She reports great improvement with counseling and bupropion .  PHQ-9 was still positive #9 today; however, this is chronic.  She has assured me multiple times in the past that she has no active SI, and I am reassured that she reports great improvement as above.  Follow-up in 3 to 6 months since she is stable. Prediabetes A1c improved to 5.6 from 5.9 five months ago.  Continue metformin  as below as well as lifestyle changes. Hidradenitis suppurativa Overall flare frequency increased to every 3 to 4 months.  Continue metformin  and doxycycline .  She will follow-up with dermatology this month. Essential hypertension Mildly uncontrolled without taking amlodipine  today.  She will take  this when she gets home. Tobacco abuse Now down to half a pack per day from 1 pack/day.  Congratulated on improvement.  Continue bupropion .  Also advised tobacco's effects on her HS.   Stuart Redo, MD [facility]

## 2024-01-15 NOTE — Assessment & Plan Note (Signed)
 Overall flare frequency increased to every 3 to 4 months.  Continue metformin  and doxycycline .  She will follow-up with dermatology this month.

## 2024-01-15 NOTE — Assessment & Plan Note (Signed)
 She reports great improvement with counseling and bupropion .  PHQ-9 was still positive #9 today; however, this is chronic.  She has assured me multiple times in the past that she has no active SI, and I am reassured that she reports great improvement as above.  Follow-up in 3 to 6 months since she is stable.

## 2024-01-15 NOTE — Assessment & Plan Note (Signed)
 A1c improved to 5.6 from 5.9 five months ago.  Continue metformin  as below as well as lifestyle changes.

## 2024-01-15 NOTE — Patient Instructions (Signed)
 Keep up the great work! Keep taking your medications.  Continue to follow with your sleep doctor.  Come back in 3-6 months since you are doing so well! I will be on the lookout for the dermatology notes.

## 2024-01-15 NOTE — Assessment & Plan Note (Signed)
 Now down to half a pack per day from 1 pack/day.  Congratulated on improvement.  Continue bupropion .  Also advised tobacco's effects on her HS.

## 2024-01-15 NOTE — Assessment & Plan Note (Signed)
 Mildly uncontrolled without taking amlodipine  today.  She will take this when she gets home.

## 2024-01-25 ENCOUNTER — Ambulatory Visit: Admitting: Physician Assistant

## 2024-01-25 ENCOUNTER — Encounter: Payer: Self-pay | Admitting: Physician Assistant

## 2024-01-25 VITALS — BP 167/113

## 2024-01-25 DIAGNOSIS — L732 Hidradenitis suppurativa: Secondary | ICD-10-CM

## 2024-01-25 DIAGNOSIS — Z792 Long term (current) use of antibiotics: Secondary | ICD-10-CM | POA: Diagnosis not present

## 2024-01-25 DIAGNOSIS — Z720 Tobacco use: Secondary | ICD-10-CM

## 2024-01-25 DIAGNOSIS — Z7984 Long term (current) use of oral hypoglycemic drugs: Secondary | ICD-10-CM | POA: Diagnosis not present

## 2024-01-25 NOTE — Patient Instructions (Addendum)
 Discussed biologic treatment for Hidradenitis Suppurativa - Humira, Cosentyx or Bimzelx are the 3 medications discussed today.   Hidradenitis Suppurativa [employer]             Important Information  Due to recent changes in healthcare laws, you may see results of your pathology and/or laboratory studies on MyChart before the doctors have had a chance to review them. We understand that in some cases there may be results that are confusing or concerning to you. Please understand that not all results are received at the same time and often the doctors may need to interpret multiple results in order to provide you with the best plan of care or course of treatment. Therefore, we ask that you please give us  2 business days to thoroughly review all your results before contacting the office for clarification. Should we see a critical lab result, you will be contacted sooner.   If You Need Anything After Your Visit  If you have any questions or concerns for your doctor, please call our main line at [phone] If no one answers, please leave a voicemail as directed and we will return your call as soon as possible. Messages left after 4 pm will be answered the following business day.   You may also send us  a message via MyChart. We typically respond to MyChart messages within 1-2 business days.  For prescription refills, please ask your pharmacy to contact our office. Our fax number is [phone].  If you have an urgent issue when the clinic is closed that cannot wait until the next business day, you can page your doctor at the number below.    Please note that while we do our best to be available for urgent issues outside of office hours, we are not available 24/7.   If you have an urgent issue and are unable to reach us , you may choose to seek medical care at your doctor's office, retail clinic, urgent care center, or emergency room.  If you have a medical emergency, please immediately  call 911 or go to the emergency department. In the event of inclement weather, please call our main line at [phone] for an update on the status of any delays or closures.  Dermatology Medication Tips: Please keep the boxes that topical medications come in in order to help keep track of the instructions about where and how to use these. Pharmacies typically print the medication instructions only on the boxes and not directly on the medication tubes.   If your medication is too expensive, please contact our office at [phone] or send us  a message through MyChart.   We are unable to tell what your co-pay for medications will be in advance as this is different depending on your insurance coverage. However, we may be able to find a substitute medication at lower cost or fill out paperwork to get insurance to cover a needed medication.   If a prior authorization is required to get your medication covered by your insurance company, please allow us  1-2 business days to complete this process.  Drug prices often vary depending on where the prescription is filled and some pharmacies may offer cheaper prices.  The website www.goodrx.com contains coupons for medications through different pharmacies. The prices here do not account for what the cost may be with help from insurance (it may be cheaper with your insurance), but the website can give you the price if you did not use any insurance.  - You can print the  associated coupon and take it with your prescription to the pharmacy.  - You may also stop by our office during regular business hours and pick up a GoodRx coupon card.  - If you need your prescription sent electronically to a different pharmacy, notify our office through [facility] or by phone at [phone]

## 2024-01-25 NOTE — Progress Notes (Signed)
   New Patient Visit   Subjective  Tiffany Allen is a 38 y.o. female who presents for the following: Hidradenitis suppurativa. She reports it started when she was 37 but was officially diagnosed 1 year ago. She takes doxycycline  100 mg and metformin  500 mg daily. She washes with Dove Sensitive. She has not had as many flares since starting doxycycline  and metformin . She is not diabetic but is an everyday smoker. She has cut back from 2 ppd to 1/2 ppd.   Accompanied by god mother today.   The following portions of the chart were reviewed this encounter and updated as appropriate: medications, allergies, medical history  Review of Systems:  No other skin or systemic complaints except as noted in HPI or Assessment and Plan.  Objective  Well appearing patient in no apparent distress; mood and affect are within normal limits.  A focused examination was performed of the following areas: Breast, axillary areas, groin areas   Relevant exam findings are noted in the Assessment and Plan.    Assessment & Plan    HIDRADENITIS SUPPURATIVA - INADEQUATELY CONTROLLED.  Exam: weeping subcutaneous papules, nodules and scars    Hidradenitis Suppurativa is a chronic; persistent; non-curable, but treatable condition due to abnormal inflamed sweat glands in the body folds (axilla, inframammary, groin, medial thighs), causing recurrent painful draining cysts and scarring. It can be associated with severe scarring acne and cysts; also abscesses and scarring of scalp. The goal is control and prevention of flares, as it is not curable. Scars are permanent and can be thickened. Treatment may include daily use of topical medication and oral antibiotics.  Oral isotretinoin may also be helpful.  For some cases, Humira or Cosentyx (biologic injections) may be prescribed to decrease the inflammatory process and prevent flares.  When indicated, inflamed cysts may also be treated surgically.  Treatment  Plan: Recommend alternating washing with Panoxyl 10 wash, Hibiclens and Dial Antibacterial soap.  Continue Doxycycline  100 mg daily and Metformin  500 mg daily. Advised patient that she may want to try to stop doxycycline  once she is improved and try to take with flares - recommend she discuss this with her PCP since he is the one who prescribed doxycyline.  Commended her on reducing her smoking. Recommend she stop smoking since it can exacerbate her condition. Weight reduction also recommended.   Discussed Cosentyx vs Humira vs Bimzelx. I asked patient to read and educate herself on these medications as they can significantly help with her condition. Informational brochure provided to patient on Cosentyx.      No follow-ups on file.  I, Roseline Hutchinson, CMA, am acting as scribe for Aquilla Voiles K, PA-C .   Documentation: I have reviewed the above documentation for accuracy and completeness, and I agree with the above.  Jabier Deese K, PA-C

## 2024-05-08 NOTE — Progress Notes (Signed)
 Chief Complaint  Patient presents with   Urinary Tract Infection    Low back pain/abdominal pain and malodorous urine x1 week. Having frequency and urgency to urinate, and stinging pain at end of urination. Not taking any meds; drinking cranberry juice. Would like STD panel as well.     has a past medical history of Hydradenitis and Hypertension.  HPI Tiffany Allen is a 38 year old who presents to clinic with 1 week history of malodorous urine, abdominal pain, frequency, urgency to urinate, and burning with urination.  She has not been taking any medication for this but has been drinking cranberry juice.  She does want to be screened for STI including blood work.  She has a history of type 2 diabetes.  Most recent hemoglobin A1c was 5.6% on January 15, 2024.  Patient is currently on no medication and has no medical condition that may reduce immunity or increase the risk for serious infection.  Tobacco Use History[1]  Review of Systems  Review of systems is otherwise negative except as noted in the HPI and Assessment/MDM  Physical Exam   BP (!) 120/95   Pulse 88   Temp 96.8 F (36 C) (Tympanic)   Resp 18   Wt 136 kg (299 lb)   LMP 04/12/2024 (Approximate)   SpO2 100%   BMI 54.69 kg/m     Constitutional:      General: Patient is not in acute respiratory distress.    Appearance: Normal appearance.  Mild suprapubic tenderness otherwise abdomen is nontender.   Xray Results for this visit  No orders to display    Blood and Point of Care over last 48 hours  No results found for this or any previous visit (from the past 48 hours).     1. Routine screening for STI (sexually transmitted infection)  Bacterial Vaginosis (BV), Candida, and Trichomonas via NAA (Swab)   Rapid Plasma Reagin (RPR), Qualitative Test with Reflex to Titer and Confirmation   HIV Screen with Reflex to Confirmation   Hepatitis C Virus (HCV) Antibody Screen With Confirmation   Chlamydia / Gonococcus (GC),  NAAT    2. Dysuria  POC Urinalysis Auto without Microscopic   POC HCG Qualitative, Urine    Tiffany Allen is a 38 year old presenting with vaginal and urinary odor, dysuria, and lower abdominal pain has been going on for the past week.  She has had no fever, chills, sweats, nausea, vomiting.  I have a low suspicion for pyelonephritis.  Vital signs today show that she is not tachycardic, tachypneic, and is not febrile.  UA today shows trace of protein but no sign of infection and no blood.  Due to the urinary/vaginal odor  I suspect she has bacterial vaginosis.  She collected a swab to test for BV, yeast, and trichomonas.  Since her urine shows no sign of infection, we will start empiric treatment for bacterial vaginosis with metronidazole.  She was reminded not to drink alcohol while taking metronidazole.   Urine pregnancy test is negative  She also requested routine STI screenings.  This is appropriate given her vaginal discharge and lower abdominal tenderness.  Checking GC chlamydia via self swab, trichomoniasis via self swab, Blood testing was also collected and we will check HIV, RPR, hepatitis C.  Impaired  She will return to clinic for any worsening lower abdominal tenderness or if symptoms do not resolve with treatment within a week.   We discussed risks and side effects of medications, and also discussed red flags which would warrant  immediate follow-up.   Urgent Care Disposition:  Home Care      [1] Social History Tobacco Use  Smoking Status Every Day   Types: Cigarettes  Smokeless Tobacco Never

## 2024-05-31 ENCOUNTER — Ambulatory Visit: Admitting: Physician Assistant

## 2024-05-31 ENCOUNTER — Encounter: Payer: Self-pay | Admitting: Physician Assistant

## 2024-05-31 VITALS — BP 123/93

## 2024-05-31 DIAGNOSIS — L732 Hidradenitis suppurativa: Secondary | ICD-10-CM

## 2024-05-31 NOTE — Patient Instructions (Signed)

## 2024-05-31 NOTE — Progress Notes (Signed)
° °  Follow Up Visit   Subjective  Tiffany Allen is a 38 y.o. female ESTABLISHED PATIENT who presents for the following: HS 4 month follow up. She is alternating Dial, Hibiclens and Panoxyl. She stopped doxycycline  and metformin  after her last appointment for no specific reason. She is flared today on her breasts, abdomen and groin area. She has decreased her smoking down from 1/2 pack to 5 cigarettes a day. She has researched Cosentyx and she is not interested due to the potential side effects.  Accompanied by her godmother today.  The following portions of the chart were reviewed this encounter and updated as appropriate: medications, allergies, medical history  Review of Systems:  No other skin or systemic complaints except as noted in HPI or Assessment and Plan.  Objective  Well appearing patient in no apparent distress; mood and affect are within normal limits.  A focused examination was performed of the following areas: Axillary areas, inframammary areas, infra-panus and inguinal region    Relevant exam findings are noted in the Assessment and Plan.    Assessment & Plan   HIDRADENITIS SUPPURATIVA   This Visit - Ambulatory referral to General Surgery  HIDRADENITIS SUPPURATIVA Exam: scars, inflammatory nodules, open comedones and hyperpigmentation     Treatment Plan: Continue alternating Dial antibacterial soap, Hibiclens and Panoxyl wash.  Continue smoking cessation.  Discussed Consentyx vs Humira vs Bimzelx. Advised patient that all side effects warnings are potential side effects.   Discussed surgery. We can make a referral for evaluation with general surgery if she would like. Patient is in agreement. 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    Return if symptoms worsen or fail to improve.  I, Roseline Hutchinson, CMA, am acting as scribe for Aceyn Kathol K, PA-C .   Documentation: I have reviewed the above  documentation for accuracy and completeness, and I agree with the above.  Adeel Guiffre K, PA-C
